# Patient Record
Sex: Male | Born: 1945
Health system: Southern US, Community
[De-identification: ages and names within clinical notes are randomized; demographics above are authoritative.]

## PROBLEM LIST (undated history)

## (undated) DIAGNOSIS — I10 Essential (primary) hypertension: Secondary | ICD-10-CM

## (undated) DIAGNOSIS — N189 Chronic kidney disease, unspecified: Secondary | ICD-10-CM

## (undated) DIAGNOSIS — N529 Male erectile dysfunction, unspecified: Secondary | ICD-10-CM

## (undated) DIAGNOSIS — Z992 Dependence on renal dialysis: Secondary | ICD-10-CM

## (undated) DIAGNOSIS — J449 Chronic obstructive pulmonary disease, unspecified: Secondary | ICD-10-CM

## (undated) DIAGNOSIS — M1990 Unspecified osteoarthritis, unspecified site: Secondary | ICD-10-CM

## (undated) DIAGNOSIS — N281 Cyst of kidney, acquired: Secondary | ICD-10-CM

## (undated) DIAGNOSIS — K7460 Unspecified cirrhosis of liver: Secondary | ICD-10-CM

## (undated) DIAGNOSIS — F329 Major depressive disorder, single episode, unspecified: Secondary | ICD-10-CM

## (undated) DIAGNOSIS — C801 Malignant (primary) neoplasm, unspecified: Secondary | ICD-10-CM

## (undated) DIAGNOSIS — N186 End stage renal disease: Secondary | ICD-10-CM

## (undated) DIAGNOSIS — F32A Depression, unspecified: Secondary | ICD-10-CM

## (undated) DIAGNOSIS — K5792 Diverticulitis of intestine, part unspecified, without perforation or abscess without bleeding: Secondary | ICD-10-CM

## (undated) DIAGNOSIS — I509 Heart failure, unspecified: Secondary | ICD-10-CM

## (undated) DIAGNOSIS — R188 Other ascites: Secondary | ICD-10-CM

## (undated) HISTORY — PX: COLON SURGERY: SHX602

## (undated) HISTORY — DX: Cyst of kidney, acquired: N28.1

## (undated) HISTORY — DX: Chronic kidney disease, unspecified: N18.9

## (undated) HISTORY — PX: PENILE PROSTHESIS IMPLANT: SHX240

## (undated) HISTORY — PX: DIALYSIS FISTULA CREATION: SHX611

## (undated) HISTORY — PX: APPENDECTOMY: SHX54

## (undated) HISTORY — DX: Malignant (primary) neoplasm, unspecified: C80.1

---

## 2003-09-26 DIAGNOSIS — I509 Heart failure, unspecified: Secondary | ICD-10-CM

## 2003-09-26 HISTORY — DX: Heart failure, unspecified: I50.9

## 2004-09-25 DIAGNOSIS — C801 Malignant (primary) neoplasm, unspecified: Secondary | ICD-10-CM

## 2004-09-25 HISTORY — DX: Malignant (primary) neoplasm, unspecified: C80.1

## 2014-07-29 ENCOUNTER — Encounter: Payer: Self-pay | Admitting: Urology

## 2014-07-29 ENCOUNTER — Other Ambulatory Visit: Payer: Self-pay | Admitting: Urology

## 2014-07-29 DIAGNOSIS — N2889 Other specified disorders of kidney and ureter: Secondary | ICD-10-CM

## 2014-08-06 ENCOUNTER — Ambulatory Visit
Admission: RE | Admit: 2014-08-06 | Discharge: 2014-08-06 | Disposition: A | Discharge status: Home / Self Care | Payer: Medicare Other | Source: Ambulatory Visit | Attending: Urology | Admitting: Urology

## 2014-08-06 DIAGNOSIS — N2889 Other specified disorders of kidney and ureter: Secondary | ICD-10-CM

## 2014-08-06 HISTORY — DX: Male erectile dysfunction, unspecified: N52.9

## 2014-08-06 HISTORY — DX: Essential (primary) hypertension: I10

## 2014-08-06 HISTORY — DX: End stage renal disease: Z99.2

## 2014-08-06 HISTORY — DX: Unspecified osteoarthritis, unspecified site: M19.90

## 2014-08-06 HISTORY — DX: Depression, unspecified: F32.A

## 2014-08-06 HISTORY — DX: Diverticulitis of intestine, part unspecified, without perforation or abscess without bleeding: K57.92

## 2014-08-06 HISTORY — DX: End stage renal disease: N18.6

## 2014-08-06 HISTORY — DX: Major depressive disorder, single episode, unspecified: F32.9

## 2014-08-06 HISTORY — DX: Heart failure, unspecified: I50.9

## 2014-08-06 NOTE — Consult Note (Signed)
Chief Complaint: 17 mm left upper pole enhancing solid renal mass consistent with a low-grade renal cell carcinoma.  Referring Physician(s): Eskew, [facility] regional physicians Urology  History of Present Illness: Michael Graham is a 67 y.o. male who was referred for evaluation of a solid enhancing left renal mass in the upper pole. Lesion measures 17 mm in greatest diameter. Findings are compatible with a small low-grade renal cell carcinoma. Patient is end stage renal disease and on dialysis. He is asymptomatic. No current flank or abdominal pain. No gross hematuria. Comparing prior CT scans, the left upper pole renal mass has enlarged since 2013.  Past Medical History  Diagnosis Date  . Chronic kidney disease     ESRD; on dialysis since 2008 or 2009  . Renal cysts, acquired, bilateral   . CHF (congestive heart failure) 2005  . Arthritis   . Cancer 2006    prostate; radiation therapy 2006?  Marland Kitchen Depression   . Diverticulitis   . Hypertension   . End stage renal disease on dialysis   . Erectile dysfunction     Past Surgical History  Procedure Laterality Date  . Appendectomy    . Penile prosthesis implant    . Colon surgery      partial colectomy    Allergies: Review of patient's allergies indicates no known allergies.  Medications: Prior to Admission medications   Medication Sig Start Date End Date Taking? Authorizing Provider  cinacalcet (SENSIPAR) 30 MG tablet Take 30 mg by mouth daily.   Yes Historical Provider, MD  latanoprost (XALATAN) 0.005 % ophthalmic solution Place 1 drop into both eyes at bedtime.   Yes Historical Provider, MD  metoprolol (LOPRESSOR) 50 MG tablet Take 50 mg by mouth daily.   Yes Historical Provider, MD  terazosin (HYTRIN) 5 MG capsule Take 5 mg by mouth at bedtime.   Yes Historical Provider, MD    No family history on file.  History   Social History  . Marital Status: Single    Spouse Name: N/A    Number of Children: N/A  . Years of  Education: N/A   Social History Main Topics  . Smoking status: Former Smoker -- 1.00 packs/day for 23 years    Types: Cigarettes    Start date: 08/07/1971    Quit date: 08/06/1994  . Smokeless tobacco: Never Used  . Alcohol Use: No  . Drug Use: Not on file  . Sexual Activity: Not on file   Other Topics Concern  . Not on file   Social History Narrative     Review of Systems: A 12 point ROS discussed and pertinent positives are indicated in the HPI above.  All other systems are negative.  Review of Systems  Constitutional: Negative for fever, chills, activity change and appetite change.  Respiratory: Negative for cough, chest tightness and shortness of breath.   Cardiovascular: Negative for chest pain.  Gastrointestinal: Negative for abdominal distention.  Genitourinary: Negative for dysuria, hematuria and flank pain.    Vital Signs: BP 129/74 mmHg  Pulse 62  Temp(Src) 98.4 F (36.9 C) (Oral)  Resp 14  Ht 5\' 8"  (1.727 m)  Wt 209 lb (94.802 kg)  BMI 31.79 kg/m2  SpO2 100%  Physical Exam  Constitutional: He is oriented to person, place, and time. He appears well-developed and well-nourished. No distress.  Cardiovascular: Normal rate and regular rhythm.   Murmur heard. Pulmonary/Chest: Effort normal and breath sounds normal. No respiratory distress. He has no wheezes.  Abdominal:  Soft. Bowel sounds are normal. He exhibits no distension.  Neurological: He is alert and oriented to person, place, and time.  Skin: Skin is warm. He is not diaphoretic. No erythema.  Psychiatric: He has a normal mood and affect. His behavior is normal. Judgment and thought content normal.    Imaging: Review of his abdominal CT scans and MRI scan confirm an enlarging 17 mm left upper pole solid enhancing renal mass compared to 2013. This is compatible with a small low-grade renal neoplasm or renal cell carcinoma. Other bilateral renal simple and complex cysts are stable Kidneys were atrophic.  No hydronephrosis.  Assessment and Plan:  1.7 cm solid enhancing left upper pole renal mass demonstrating enlargement since 2013 consistent with a low-grade renal neoplasm or renal cell carcinoma. Lesion is posterior and exophytic. This would be amenable to CT-guided cryoablation. Procedure, risks, benefits, and alternatives were reviewed. Patient understands the procedure. All questions were addressed. This can be performed under general anesthesia with an overnight recovery at [facility]. After our discussion, he would like to proceed with scheduling the procedure.  Thank you for this interesting consult.  I greatly enjoyed meeting Michael Graham and look forward to participating in their care.    I spent a total of 30 minutes face to face in clinical consultation, greater than 50% of which was counseling/coordinating care for the patient.  SignedGreggory Keen T. 08/06/2014, 2:56 PM

## 2014-09-07 HISTORY — PX: RENAL CRYOABLATION: SHX2322

## 2014-09-11 ENCOUNTER — Other Ambulatory Visit: Payer: Self-pay | Admitting: Radiology

## 2014-09-11 DIAGNOSIS — N2889 Other specified disorders of kidney and ureter: Secondary | ICD-10-CM

## 2014-09-23 ENCOUNTER — Other Ambulatory Visit (HOSPITAL_COMMUNITY): Payer: Self-pay | Admitting: Interventional Radiology

## 2014-09-23 ENCOUNTER — Encounter: Payer: Self-pay | Admitting: Radiology

## 2014-09-23 DIAGNOSIS — N2889 Other specified disorders of kidney and ureter: Secondary | ICD-10-CM

## 2014-09-29 ENCOUNTER — Emergency Department (HOSPITAL_BASED_OUTPATIENT_CLINIC_OR_DEPARTMENT_OTHER)
Admission: EM | Admit: 2014-09-29 | Discharge: 2014-09-29 | Disposition: A | Discharge status: Home / Self Care | Payer: Medicare Other | Attending: Emergency Medicine | Admitting: Emergency Medicine

## 2014-09-29 ENCOUNTER — Emergency Department (HOSPITAL_BASED_OUTPATIENT_CLINIC_OR_DEPARTMENT_OTHER): Payer: Medicare Other

## 2014-09-29 ENCOUNTER — Encounter (HOSPITAL_BASED_OUTPATIENT_CLINIC_OR_DEPARTMENT_OTHER): Payer: Self-pay | Admitting: Emergency Medicine

## 2014-09-29 DIAGNOSIS — N186 End stage renal disease: Secondary | ICD-10-CM | POA: Insufficient documentation

## 2014-09-29 DIAGNOSIS — I120 Hypertensive chronic kidney disease with stage 5 chronic kidney disease or end stage renal disease: Secondary | ICD-10-CM | POA: Insufficient documentation

## 2014-09-29 DIAGNOSIS — J069 Acute upper respiratory infection, unspecified: Secondary | ICD-10-CM | POA: Insufficient documentation

## 2014-09-29 DIAGNOSIS — Z992 Dependence on renal dialysis: Secondary | ICD-10-CM | POA: Insufficient documentation

## 2014-09-29 DIAGNOSIS — I509 Heart failure, unspecified: Secondary | ICD-10-CM | POA: Diagnosis not present

## 2014-09-29 DIAGNOSIS — R531 Weakness: Secondary | ICD-10-CM | POA: Diagnosis present

## 2014-09-29 DIAGNOSIS — Z8719 Personal history of other diseases of the digestive system: Secondary | ICD-10-CM | POA: Insufficient documentation

## 2014-09-29 DIAGNOSIS — Z87891 Personal history of nicotine dependence: Secondary | ICD-10-CM | POA: Insufficient documentation

## 2014-09-29 DIAGNOSIS — Z87438 Personal history of other diseases of male genital organs: Secondary | ICD-10-CM | POA: Diagnosis not present

## 2014-09-29 DIAGNOSIS — Z8739 Personal history of other diseases of the musculoskeletal system and connective tissue: Secondary | ICD-10-CM | POA: Insufficient documentation

## 2014-09-29 DIAGNOSIS — Z79899 Other long term (current) drug therapy: Secondary | ICD-10-CM | POA: Diagnosis not present

## 2014-09-29 DIAGNOSIS — Z8659 Personal history of other mental and behavioral disorders: Secondary | ICD-10-CM | POA: Diagnosis not present

## 2014-09-29 DIAGNOSIS — Z8546 Personal history of malignant neoplasm of prostate: Secondary | ICD-10-CM | POA: Insufficient documentation

## 2014-09-29 LAB — CBC
HCT: 29.8 % — ABNORMAL LOW (ref 39.0–52.0)
Hemoglobin: 9.9 g/dL — ABNORMAL LOW (ref 13.0–17.0)
MCH: 32.9 pg (ref 26.0–34.0)
MCHC: 33.2 g/dL (ref 30.0–36.0)
MCV: 99 fL (ref 78.0–100.0)
Platelets: 118 10*3/uL — ABNORMAL LOW (ref 150–400)
RBC: 3.01 MIL/uL — ABNORMAL LOW (ref 4.22–5.81)
RDW: 14.1 % (ref 11.5–15.5)
WBC: 7.2 10*3/uL (ref 4.0–10.5)

## 2014-09-29 LAB — BASIC METABOLIC PANEL
ANION GAP: 11 (ref 5–15)
BUN: 42 mg/dL — AB (ref 6–23)
CALCIUM: 8.5 mg/dL (ref 8.4–10.5)
CHLORIDE: 101 meq/L (ref 96–112)
CO2: 25 mmol/L (ref 19–32)
Creatinine, Ser: 7.75 mg/dL — ABNORMAL HIGH (ref 0.50–1.35)
GFR calc Af Amer: 7 mL/min — ABNORMAL LOW (ref >90)
GFR calc non Af Amer: 6 mL/min — ABNORMAL LOW (ref >90)
GLUCOSE: 126 mg/dL — AB (ref 70–99)
Potassium: 4.2 mmol/L (ref 3.5–5.1)
Sodium: 137 mmol/L (ref 135–145)

## 2014-09-29 LAB — TROPONIN I
Troponin I: 0.08 ng/mL — ABNORMAL HIGH (ref <0.031)
Troponin I: 0.09 ng/mL — ABNORMAL HIGH (ref <0.031)

## 2014-09-29 LAB — BRAIN NATRIURETIC PEPTIDE: B Natriuretic Peptide: 915.5 pg/mL — ABNORMAL HIGH (ref 0.0–100.0)

## 2014-09-29 NOTE — ED Notes (Signed)
69 yo male c/o leg weakness bilateraly for a week. Pt states he was dx w/ pneumonia Saturday and on antibiotic and steroids. Dialysis pt MWF. Pt a/o and ambulatory.

## 2014-09-29 NOTE — ED Provider Notes (Signed)
CSN: [id number]     Arrival date & time 09/29/14  1714 History   First MD Initiated Contact with Patient 09/29/14 1727     Chief Complaint  Patient presents with  . Extremity Weakness    legs     (Consider location/radiation/quality/duration/timing/severity/associated sxs/prior Treatment) Patient is a 69 y.o. male presenting with general illness.  Illness Location:  Nasal, upper airways, bil le Quality:  Congestion, weakness Severity:  Moderate Onset quality:  Gradual Timing:  Constant Progression:  Worsening Chronicity:  Recurrent Context:  Recent renal biopsy and PNA 1.5 weeks ago sp abx and steroid course Relieved by:  Nothing Worsened by:  Nothing Associated symptoms: cough and rhinorrhea   Associated symptoms: no abdominal pain and no fever (has had chills)   Associated symptoms comment:  Bil le weakness   Past Medical History  Diagnosis Date  . Chronic kidney disease     ESRD; on dialysis since 2008 or 2009  . Renal cysts, acquired, bilateral   . CHF (congestive heart failure) 2005  . Arthritis   . Cancer 2006    prostate; radiation therapy 2006?  Marland Kitchen Depression   . Diverticulitis   . Hypertension   . End stage renal disease on dialysis   . Erectile dysfunction    Past Surgical History  Procedure Laterality Date  . Appendectomy    . Penile prosthesis implant    . Colon surgery      partial colectomy  . Renal cryoablation Left 09/07/2014   No family history on file. History  Substance Use Topics  . Smoking status: Former Smoker -- 1.00 packs/day for 23 years    Types: Cigarettes    Start date: 08/07/1971    Quit date: 08/06/1994  . Smokeless tobacco: Never Used  . Alcohol Use: No    Review of Systems  Constitutional: Negative for fever (has had chills).  HENT: Positive for rhinorrhea.   Respiratory: Positive for cough.   Gastrointestinal: Negative for abdominal pain.  All other systems reviewed and are negative.     Allergies  Review of  patient's allergies indicates no known allergies.  Home Medications   Prior to Admission medications   Medication Sig Start Date End Date Taking? Authorizing Provider  cinacalcet (SENSIPAR) 30 MG tablet Take 30 mg by mouth daily.    Historical Provider, MD  latanoprost (XALATAN) 0.005 % ophthalmic solution Place 1 drop into both eyes at bedtime.    Historical Provider, MD  metoprolol (LOPRESSOR) 50 MG tablet Take 50 mg by mouth daily.    Historical Provider, MD  terazosin (HYTRIN) 5 MG capsule Take 5 mg by mouth at bedtime.    Historical Provider, MD   BP 147/74 mmHg  Pulse 65  Temp(Src) 97.8 F (36.6 C) (Oral)  Resp 18  Ht 5\' 8"  (1.727 m)  Wt 211 lb (95.709 kg)  BMI 32.09 kg/m2  SpO2 100% Physical Exam  Constitutional: He is oriented to person, place, and time. He appears well-developed and well-nourished.  HENT:  Head: Normocephalic and atraumatic.  Eyes: Conjunctivae and EOM are normal.  Neck: Normal range of motion. Neck supple.  Cardiovascular: Normal rate, regular rhythm and normal heart sounds.   Pulmonary/Chest: Effort normal. No respiratory distress. He has rales in the right lower field and the left lower field.  Abdominal: He exhibits no distension. There is no tenderness. There is no rebound and no guarding.  Musculoskeletal: Normal range of motion.  Neurological: He is alert and oriented to person, place, and  time.  Skin: Skin is warm and dry.  Vitals reviewed.   ED Course  Procedures (including critical care time) Labs Review Labs Reviewed  BASIC METABOLIC PANEL - Abnormal; Notable for the following:    Glucose, Bld 126 (*)    BUN 42 (*)    Creatinine, Ser 7.75 (*)    GFR calc non Af Amer 6 (*)    GFR calc Af Amer 7 (*)    All other components within normal limits  CBC - Abnormal; Notable for the following:    RBC 3.01 (*)    Hemoglobin 9.9 (*)    HCT 29.8 (*)    Platelets 118 (*)    All other components within normal limits  TROPONIN I - Abnormal;  Notable for the following:    Troponin I 0.08 (*)    All other components within normal limits  BRAIN NATRIURETIC PEPTIDE - Abnormal; Notable for the following:    B Natriuretic Peptide 915.5 (*)    All other components within normal limits  TROPONIN I - Abnormal; Notable for the following:    Troponin I 0.09 (*)    All other components within normal limits    Imaging Review Dg Chest 2 View  09/29/2014   CLINICAL DATA:  Congestion, fever, cough.  Weakness.  EXAM: CHEST  2 VIEW  COMPARISON:  Radiographs and Chest CT 09/19/2014  FINDINGS: Decreased small bilateral pleural effusions. Increasing linear opacities in the right lower lung zone. Cardiomegaly and mediastinal contours are unchanged. There is no pulmonary edema. No pneumothorax. Left subclavian vascular stent again seen. No acute osseous abnormality.  IMPRESSION: Increasing linear opacities in the right lower lung zone, may reflect atelectasis, however pneumonia could have a similar appearance. Decreasing pleural effusions from prior.   Electronically Signed   By: Jeb Levering M.D.   On: 09/29/2014 18:17     EKG Interpretation   Date/Time:  Tuesday September 29 2014 17:54:00 EST Ventricular Rate:  65 PR Interval:  338 QRS Duration: 112 QT Interval:  464 QTC Calculation: 482 R Axis:   61 Text Interpretation:  Sinus rhythm with 1st degree A-V block Low voltage  QRS Incomplete left bundle branch block Prolonged QT Abnormal ECG compared  to outside ECG, no acute change from prior Confirmed by Debby Freiberg  [phone]) on 09/29/2014 7:33:43 PM      MDM   Final diagnoses:  Upper respiratory infection    69 y.o. male with pertinent PMH of ESRD MWF, Chf, reported renal cancer (unknown type), postate ca, HTN presents with recurrent rhinorrhea, cough, and generalized weakness most prominent in bil le.  No fevers, patient denies lateralizing weakness. On arrival today vitals signs and physical exam as above, unremarkable.  Obtained  labwork from [facility] and ECG, both of which are similar to labs today.  Delta troponin obtained due to weakness which was unremarkable.  He ambulated unassisted and was not ataxic.  Likely etiology of symptoms upper respiratory infection. Doubt PNA given clinical presentation, no leukocytosis, and 1 day of primarily rhinorrhea. DC home in stable condition.  I have reviewed all laboratory and imaging studies if ordered as above  1. Upper respiratory infection         Debby Freiberg, MD 09/30/14 1459

## 2014-09-29 NOTE — ED Notes (Signed)
Three iv attempts are unsuccessful. Able to obtain basic labs, md aware.

## 2014-09-29 NOTE — Discharge Instructions (Signed)
Cough, Adult ° A cough is a reflex that helps clear your throat and airways. It can help heal the body or may be a reaction to an irritated airway. A cough may only last 2 or 3 weeks (acute) or may last more than 8 weeks (chronic).  °CAUSES °Acute cough: °· Viral or bacterial infections. °Chronic cough: °· Infections. °· Allergies. °· Asthma. °· Post-nasal drip. °· Smoking. °· Heartburn or acid reflux. °· Some medicines. °· Chronic lung problems (COPD). °· Cancer. °SYMPTOMS  °· Cough. °· Fever. °· Chest pain. °· Increased breathing rate. °· High-pitched whistling sound when breathing (wheezing). °· Colored mucus that you cough up (sputum). °TREATMENT  °· A bacterial cough may be treated with antibiotic medicine. °· A viral cough must run its course and will not respond to antibiotics. °· Your caregiver may recommend other treatments if you have a chronic cough. °HOME CARE INSTRUCTIONS  °· Only take over-the-counter or prescription medicines for pain, discomfort, or fever as directed by your caregiver. Use cough suppressants only as directed by your caregiver. °· Use a cold steam vaporizer or humidifier in your bedroom or home to help loosen secretions. °· Sleep in a semi-upright position if your cough is worse at night. °· Rest as needed. °· Stop smoking if you smoke. °SEEK IMMEDIATE MEDICAL CARE IF:  °· You have pus in your sputum. °· Your cough starts to worsen. °· You cannot control your cough with suppressants and are losing sleep. °· You begin coughing up blood. °· You have difficulty breathing. °· You develop pain which is getting worse or is uncontrolled with medicine. °· You have a fever. °MAKE SURE YOU:  °· Understand these instructions. °· Will watch your condition. °· Will get help right away if you are not doing well or get worse. °Document Released: 03/10/2011 Document Revised: 12/04/2011 Document Reviewed: 03/10/2011 °ExitCare® Patient Information ©2015 [employer]. This information is not intended  to replace advice given to you by your health care provider. Make sure you discuss any questions you have with your health care provider. °Upper Respiratory Infection, Adult °An upper respiratory infection (URI) is also sometimes known as the common cold. The upper respiratory tract includes the nose, sinuses, throat, trachea, and bronchi. Bronchi are the airways leading to the lungs. Most people improve within 1 week, but symptoms can last up to 2 weeks. A residual cough may last even longer.  °CAUSES °Many different viruses can infect the tissues lining the upper respiratory tract. The tissues become irritated and inflamed and often become very moist. Mucus production is also common. A cold is contagious. You can easily spread the virus to others by oral contact. This includes kissing, sharing a glass, coughing, or sneezing. Touching your mouth or nose and then touching a surface, which is then touched by another person, can also spread the virus. °SYMPTOMS  °Symptoms typically develop 1 to 3 days after you come in contact with a cold virus. Symptoms vary from person to person. They may include: °· Runny nose. °· Sneezing. °· Nasal congestion. °· Sinus irritation. °· Sore throat. °· Loss of voice (laryngitis). °· Cough. °· Fatigue. °· Muscle aches. °· Loss of appetite. °· Headache. °· Low-grade fever. °DIAGNOSIS  °You might diagnose your own cold based on familiar symptoms, since most people get a cold 2 to 3 times a year. Your caregiver can confirm this based on your exam. Most importantly, your caregiver can check that your symptoms are not due to another disease such   as strep throat, sinusitis, pneumonia, asthma, or epiglottitis. Blood tests, throat tests, and X-rays are not necessary to diagnose a common cold, but they may sometimes be helpful in excluding other more serious diseases. Your caregiver will decide if any further tests are required. °RISKS AND COMPLICATIONS  °You may be at risk for a more severe  case of the common cold if you smoke cigarettes, have chronic heart disease (such as heart failure) or lung disease (such as asthma), or if you have a weakened immune system. The very young and very old are also at risk for more serious infections. Bacterial sinusitis, middle ear infections, and bacterial pneumonia can complicate the common cold. The common cold can worsen asthma and chronic obstructive pulmonary disease (COPD). Sometimes, these complications can require emergency medical care and may be life-threatening. °PREVENTION  °The best way to protect against getting a cold is to practice good hygiene. Avoid oral or hand contact with people with cold symptoms. Wash your hands often if contact occurs. There is no clear evidence that vitamin C, vitamin E, echinacea, or exercise reduces the chance of developing a cold. However, it is always recommended to get plenty of rest and practice good nutrition. °TREATMENT  °Treatment is directed at relieving symptoms. There is no cure. Antibiotics are not effective, because the infection is caused by a virus, not by bacteria. Treatment may include: °· Increased fluid intake. Sports drinks offer valuable electrolytes, sugars, and fluids. °· Breathing heated mist or steam (vaporizer or shower). °· Eating chicken soup or other clear broths, and maintaining good nutrition. °· Getting plenty of rest. °· Using gargles or lozenges for comfort. °· Controlling fevers with ibuprofen or acetaminophen as directed by your caregiver. °· Increasing usage of your inhaler if you have asthma. °Zinc gel and zinc lozenges, taken in the first 24 hours of the common cold, can shorten the duration and lessen the severity of symptoms. Pain medicines may help with fever, muscle aches, and throat pain. A variety of non-prescription medicines are available to treat congestion and runny nose. Your caregiver can make recommendations and may suggest nasal or lung inhalers for other symptoms.  °HOME  CARE INSTRUCTIONS  °· Only take over-the-counter or prescription medicines for pain, discomfort, or fever as directed by your caregiver. °· Use a warm mist humidifier or inhale steam from a shower to increase air moisture. This may keep secretions moist and make it easier to breathe. °· Drink enough water and fluids to keep your urine clear or pale yellow. °· Rest as needed. °· Return to work when your temperature has returned to normal or as your caregiver advises. You may need to stay home longer to avoid infecting others. You can also use a face mask and careful hand washing to prevent spread of the virus. °SEEK MEDICAL CARE IF:  °· After the first few days, you feel you are getting worse rather than better. °· You need your caregiver's advice about medicines to control symptoms. °· You develop chills, worsening shortness of breath, or brown or red sputum. These may be signs of pneumonia. °· You develop yellow or brown nasal discharge or pain in the face, especially when you bend forward. These may be signs of sinusitis. °· You develop a fever, swollen neck glands, pain with swallowing, or white areas in the back of your throat. These may be signs of strep throat. °SEEK IMMEDIATE MEDICAL CARE IF:  °· You have a fever. °· You develop severe or persistent headache, ear   pain, sinus pain, or chest pain. °· You develop wheezing, a prolonged cough, cough up blood, or have a change in your usual mucus (if you have chronic lung disease). °· You develop sore muscles or a stiff neck. °Document Released: 03/07/2001 Document Revised: 12/04/2011 Document Reviewed: 12/17/2013 °ExitCare® Patient Information ©2015 [employer]. This information is not intended to replace advice given to you by your health care provider. Make sure you discuss any questions you have with your health care provider. ° °

## 2014-10-15 ENCOUNTER — Ambulatory Visit
Admission: RE | Admit: 2014-10-15 | Discharge: 2014-10-15 | Disposition: A | Discharge status: Home / Self Care | Payer: Medicare Other | Source: Ambulatory Visit | Attending: Radiology | Admitting: Radiology

## 2014-10-15 DIAGNOSIS — N2889 Other specified disorders of kidney and ureter: Secondary | ICD-10-CM

## 2014-10-15 HISTORY — PX: IR GENERIC HISTORICAL: IMG1180011

## 2014-10-15 NOTE — Consult Note (Signed)
Chief Complaint: Chief Complaint  Patient presents with  . Follow-up    Cryoablation of Left Renal Mass    Referring Physician(s): Eskew, Urology - [facility] regional physicians.  History of Present Illness: Michael Graham is a 69 y.o. male with past medical history significant for end-stage renal disease who was found to have a solid enhancing renal mass within the upper pole of his left kidney. This lesion was noted to demonstrate minimal growth since 2013 and given poor surgical candidacy, the patient was referred for percutaneous renal cryoablation and biopsy which was performed at [facility] on 09/10/2014. Note, the patient was initially seen in consultation by my partner, Dr. Daryll Brod, however I performed the procedure as the patient wished to have the procedure performed before the end of the year and Dr. Fritz Pickerel schedule could not accommodate this request. Pathology confirmed an oncocytic lesion, most likely an oncocytoma.  Patient tolerated the procedure very well and was discharged the following day without incident. He states that he has fully recovered from the procedure and has returned to all activities of daily living. He denies flank pain. The patient is on dialysis though intermittently voids small amounts. He denies gross hematuria. He denies fever or chills. He is without complaint and pleased with his overall recovery.  The patient presents today for review of immediate postprocedural scan performed 10/13/2014. He is unaccompanied and serves as his own historian.  Past Medical History  Diagnosis Date  . Chronic kidney disease     ESRD; on dialysis since 2008 or 2009  . Renal cysts, acquired, bilateral   . CHF (congestive heart failure) 2005  . Arthritis   . Cancer 2006    prostate; radiation therapy 2006?  Marland Kitchen Depression   . Diverticulitis   . Hypertension   . End stage renal disease on dialysis   . Erectile dysfunction     Past Surgical  History  Procedure Laterality Date  . Appendectomy    . Penile prosthesis implant    . Colon surgery      partial colectomy  . Renal cryoablation Left 09/07/2014    Allergies: Review of patient's allergies indicates no known allergies.  Medications: Prior to Admission medications   Medication Sig Start Date End Date Taking? Authorizing Provider  cinacalcet (SENSIPAR) 30 MG tablet Take 30 mg by mouth daily.    Historical Provider, MD  latanoprost (XALATAN) 0.005 % ophthalmic solution Place 1 drop into both eyes at bedtime.    Historical Provider, MD  metoprolol (LOPRESSOR) 50 MG tablet Take 50 mg by mouth daily.    Historical Provider, MD  terazosin (HYTRIN) 5 MG capsule Take 5 mg by mouth at bedtime.    Historical Provider, MD    No family history on file.  History   Social History  . Marital Status: Married    Spouse Name: N/A    Number of Children: N/A  . Years of Education: N/A   Social History Main Topics  . Smoking status: Former Smoker -- 1.00 packs/day for 23 years    Types: Cigarettes    Start date: 08/07/1971    Quit date: 08/06/1994  . Smokeless tobacco: Never Used  . Alcohol Use: No  . Drug Use: Not on file  . Sexual Activity: Not on file   Other Topics Concern  . Not on file   Social History Narrative    ECOG Status: 1 - Symptomatic but completely ambulatory  Review of Systems: A  12 point ROS discussed and pertinent positives are indicated in the HPI above.  All other systems are negative.  Review of Systems  Vital Signs: BP 122/75 mmHg  Pulse 70  Temp(Src) 98.1 F (36.7 C) (Oral)  Resp 14  SpO2 98%  Physical Exam  Constitutional: He appears well-developed and well-nourished.  Abdominal: There is no tenderness. There is no rebound.  Ablation access sites along the left flank and not visible.  Skin: Skin is warm and dry.  Psychiatric: He has a normal mood and affect. His behavior is normal.    Imaging:  CT abdomen and pelvis -  10/13/2014; 06/25/2014; abdominal MRI -10/15-2015; CT guided left renal cryoablation - 09/10/2014  Selected images from preprocedural CT of the abdomen and pelvis, abdominal MRI as well as intraprocedural images from CT-guided left sided renal lesion cryoablation and biopsy as well as post procedural CT scan performed 10/13/2014 were reviewed with the patient.  Images demonstrate an excellent radiographic result with no residual enhancement of the ablated lesion arising from the posterior superior aspect of the left kidney. There is a minimal amount of expected stranding about the ablation site though there is no evidence of complication. No evidence of injury to adjacent organ.  Labs:  Pathology from sided renal cryoablation and biopsy demonstrated an oncocytic lesion, most likely an oncocytoma.   CBC:  Recent Labs  09/29/14 1750  WBC 7.2  HGB 9.9*  HCT 29.8*  PLT 118*    COAGS: No results for input(s): INR, APTT in the last 8760 hours.  BMP:  Recent Labs  09/29/14 1750  NA 137  K 4.2  CL 101  CO2 25  GLUCOSE 126*  BUN 42*  CALCIUM 8.5  CREATININE 7.75*  GFRNONAA 6*  GFRAA 7*    LIVER FUNCTION TESTS: No results for input(s): BILITOT, AST, ALT, ALKPHOS, PROT, ALBUMIN in the last 8760 hours.  TUMOR MARKERS: No results for input(s): AFPTM, CEA, CA199, CHROMGRNA in the last 8760 hours.  Assessment and Plan:  [name] is a 69 y.o. male with past medical history significant for end-stage renal disease who was found to have a solid enhancing renal mass within the upper pole of his left kidney for which he underwent a technically successful left-sided percutaneous renal cryoablation and biopsy on 09/10/2014 with pathologic analysis demonstrated an oncocytic lesion, most likely an oncocytoma.  The patient tolerated the procedure well and has recovered fully from the procedure and is without complaint.  Review of post residual CT scan demonstrates an excellent  radiographic result with no definitive residual nodular enhancing soft tissue and no evidence of complication. Images were reviewed and discussed in detail with the patient.   I will obtain a follow-up 3 month CT scan to insurecontinued involution of the ablation site. Note, the patient reported difficulty having an IV placed for his last contrast examination (due to his history of end-stage renal disease, currently on dialysis) and as such, the patient will likely require ultrasound-guided intravenous access which can be performed in the interventional radiology suite at [facility] prior to the contrased examination.  Additionally, given the likely benign pathologic result, subsequent surveillance imaging could be limited to noncontrast abdominal MRI as was obtained on 07/09/2014.  Ronny Bacon, MD   I spent a total of 15 minutes face to face in clinical consultation, greater than 50% of which was counseling/coordinating care for left sided renal lesion, post cryoablation and biopsy.  SignedSandi Mariscal 10/15/2014, 1:53 PM

## 2014-10-15 NOTE — Progress Notes (Signed)
Dialysis M, W, & Fr.  Denies hematuria.   Denies pain associated with Cryoablation.  Omarius Grantham Riki Rusk, RN 10/15/2014 1:27 PM

## 2014-12-03 ENCOUNTER — Other Ambulatory Visit (HOSPITAL_COMMUNITY): Payer: Self-pay | Admitting: Interventional Radiology

## 2014-12-03 ENCOUNTER — Other Ambulatory Visit: Payer: Self-pay | Admitting: *Deleted

## 2014-12-03 DIAGNOSIS — N2889 Other specified disorders of kidney and ureter: Secondary | ICD-10-CM

## 2014-12-31 ENCOUNTER — Ambulatory Visit
Admission: RE | Admit: 2014-12-31 | Discharge: 2014-12-31 | Disposition: A | Discharge status: Home / Self Care | Payer: Medicare Other | Source: Ambulatory Visit | Attending: Interventional Radiology | Admitting: Interventional Radiology

## 2014-12-31 DIAGNOSIS — N2889 Other specified disorders of kidney and ureter: Secondary | ICD-10-CM

## 2014-12-31 NOTE — Progress Notes (Signed)
Patient ID: Michael Graham, male   DOB: July 29, 1946, 69 y.o.   MRN: [id number]    Chief Complaint: Chief Complaint  Patient presents with  . Follow-up    follow up Cryoablation of Left Renal Mass     Referring Physician(s): Eskew, Urology - [facility]  History of Present Illness: Michael Graham is a 69 y.o. male with past medical history significant for end-stage renal disease who underwent a technically successful CT-guided left renal cryoablation on 09/10/2014 with biopsy compatible with an oncocytic lesion, most likely an oncocytoma.  The patient returns to the interventional radiology clinic today to discuss the results of his 4 month post procedural CT scan obtained 12/29/2014.  The patient is unaccompanied and serves as his own historian.  The patient is without complaint. Specifically, he denies any flank pain or procedural related discomfort. He is overall pleased with the procedure.  Past Medical History  Diagnosis Date  . Chronic kidney disease     ESRD; on dialysis since 2008 or 2009  . Renal cysts, acquired, bilateral   . CHF (congestive heart failure) 2005  . Arthritis   . Cancer 2006    prostate; radiation therapy 2006?  Marland Kitchen Depression   . Diverticulitis   . Hypertension   . End stage renal disease on dialysis   . Erectile dysfunction     Past Surgical History  Procedure Laterality Date  . Appendectomy    . Penile prosthesis implant    . Colon surgery      partial colectomy  . Renal cryoablation Left 09/07/2014    Allergies: Review of patient's allergies indicates no known allergies.  Medications: Prior to Admission medications   Medication Sig Start Date End Date Taking? Authorizing Provider  cinacalcet (SENSIPAR) 30 MG tablet Take 30 mg by mouth daily.   Yes Historical Provider, MD  latanoprost (XALATAN) 0.005 % ophthalmic solution Place 1 drop into both eyes at bedtime.   Yes Historical Provider, MD  metoprolol (LOPRESSOR) 50 MG tablet Take 50  mg by mouth daily.   Yes Historical Provider, MD  terazosin (HYTRIN) 5 MG capsule Take 5 mg by mouth at bedtime.   Yes Historical Provider, MD     No family history on file.  History   Social History  . Marital Status: Married    Spouse Name: N/A  . Number of Children: N/A  . Years of Education: N/A   Social History Main Topics  . Smoking status: Former Smoker -- 1.00 packs/day for 23 years    Types: Cigarettes    Start date: 08/07/1971    Quit date: 08/06/1994  . Smokeless tobacco: Never Used  . Alcohol Use: No  . Drug Use: Not on file  . Sexual Activity: Not on file   Other Topics Concern  . Not on file   Social History Narrative    ECOG Status: 1 - Symptomatic but completely ambulatory  Review of Systems: A 12 point ROS discussed and pertinent positives are indicated in the HPI above.  All other systems are negative.  Review of Systems  Constitutional: Negative.   Respiratory: Negative.   Cardiovascular: Negative.   Gastrointestinal: Negative.   Genitourinary: Negative.  Negative for flank pain.  Psychiatric/Behavioral: Negative.     Vital Signs: BP 106/64 mmHg  Pulse 57  Temp(Src) 98.3 F (36.8 C) (Oral)  Resp 14  SpO2 99%  Physical Exam  Imaging:  CT abdomen and pelvis - 12/29/2014; 10/13/2014; 06/25/2014; Abdominal MRI - 07/09/2014; CT guided left  sided renal cryoablation - 09/10/2014 -   Review of CT scan performed 01/08/2015 demonstrates expected involution of the cryoablation site. There is no definitive residual enhancing soft tissue to suggest locally recurrent disease.  Labs:  CBC:  Recent Labs  09/29/14 1750  WBC 7.2  HGB 9.9*  HCT 29.8*  PLT 118*    COAGS: No results for input(s): INR, APTT in the last 8760 hours.  BMP:  Recent Labs  09/29/14 1750  NA 137  K 4.2  CL 101  CO2 25  GLUCOSE 126*  BUN 42*  CALCIUM 8.5  CREATININE 7.75*  GFRNONAA 6*  GFRAA 7*   Assessment and Plan:  Michael Graham is a 69 y.o. male who  underwent a technically successful CT-guided left renal cryoablation on 09/10/2014 with biopsy compatible with an oncocytic lesion, most likely an oncocytoma.  The patient has recovered uneventfully from the procedure and is without complaint.  Review of CT scan performed 01/08/2015 demonstrates expected involution of the cryoablation site without residual enhancing soft tissue to suggest locally recurrent disease.  (Note, intravenous access was obtained with ultrasound guidance performed in the interventional radiology suite at [facility] prior to the CT scan - the patient was very thankful as previous acquisition of intravenous access has been difficult secondary to his history of end-stage renal disease).  As such, no additional intervention is warranted at this time.   I will obtain a follow-up CT scan in 6 months (06/2015).  I will again arrange for the patient to undergo an ultrasound-guided intravenous access in the interventional radiology suite at [facility] prior to the contrasted CT examination.  SignedSandi Mariscal 12/31/2014, 2:26 PM   I spent a total of 10 Minutes in face to face in clinical consultation, greater than 50% of which was counseling/coordinating care for left-sided oncocytoma, post left sided renal cryoablation.

## 2015-07-15 ENCOUNTER — Other Ambulatory Visit (HOSPITAL_COMMUNITY): Payer: Self-pay | Admitting: Interventional Radiology

## 2015-07-15 ENCOUNTER — Other Ambulatory Visit: Payer: Self-pay | Admitting: Radiology

## 2015-07-15 DIAGNOSIS — N2889 Other specified disorders of kidney and ureter: Secondary | ICD-10-CM

## 2015-08-12 ENCOUNTER — Ambulatory Visit
Admission: RE | Admit: 2015-08-12 | Discharge: 2015-08-12 | Disposition: A | Discharge status: Home / Self Care | Payer: Medicare Other | Source: Ambulatory Visit | Attending: Interventional Radiology | Admitting: Interventional Radiology

## 2015-08-12 DIAGNOSIS — N2889 Other specified disorders of kidney and ureter: Secondary | ICD-10-CM

## 2015-08-12 NOTE — Progress Notes (Signed)
Patient ID: Michael Graham, male   DOB: 1945/12/02, 69 y.o.   MRN: [id number]    Chief Complaint: Patient was seen in consultation today for  Chief Complaint  Patient presents with  . Follow-up    11 mo follow upCryoablation of Left Renal Cell Carcinoma   at the request of Olamae Ferrara  Referring Physician(s): Eskew  History of Present Illness: Michael Graham is a 69 y.o. male with past oral history significant for end-stage renal disease who underwent a technically successful CT-guided left renal cryoablation on 09/10/2014 with biopsy compatible with an oncocytic lesion, most likely an oncocytoma.  The patient returns to the interventional radiology clinic today to discuss the results of 11 month post procedural CT scan obtained 08/05/2015. The patient is unaccompanied and serves as his own historian.  The patient reports he has recently been diagnosed with bladder cancer for which he is currently considering treatment options including (per the patient) bilateral nephrectomy and cystectomy versus intra-bladder chemotherapy administration.   The patient is otherwise without complaint. He denies fever or chills. No unintentional weight loss or weight gain.   Past Medical History  Diagnosis Date  . Chronic kidney disease     ESRD; on dialysis since 2008 or 2009  . Renal cysts, acquired, bilateral   . CHF (congestive heart failure) 2005  . Arthritis   . Cancer 2006    prostate; radiation therapy 2006?  Marland Kitchen Depression   . Diverticulitis   . Hypertension   . End stage renal disease on dialysis   . Erectile dysfunction     Past Surgical History  Procedure Laterality Date  . Appendectomy    . Penile prosthesis implant    . Colon surgery      partial colectomy  . Renal cryoablation Left 09/07/2014    Allergies: Lisinopril  Medications: Prior to Admission medications   Medication Sig Start Date End Date Taking? Authorizing Provider  atenolol (TENORMIN) 12.5 mg TABS tablet Take  12.5 mg by mouth daily.   Yes Historical Provider, MD  cinacalcet (SENSIPAR) 30 MG tablet Take 30 mg by mouth daily.    Historical Provider, MD  latanoprost (XALATAN) 0.005 % ophthalmic solution Place 1 drop into both eyes at bedtime.    Historical Provider, MD  metoprolol (LOPRESSOR) 50 MG tablet Take 50 mg by mouth daily.    Historical Provider, MD  terazosin (HYTRIN) 5 MG capsule Take 5 mg by mouth at bedtime.    Historical Provider, MD     No family history on file.  Social History   Social History  . Marital Status: Married    Spouse Name: N/A  . Number of Children: N/A  . Years of Education: N/A   Social History Main Topics  . Smoking status: Former Smoker -- 1.00 packs/day for 23 years    Types: Cigarettes    Start date: 08/07/1971    Quit date: 08/06/1994  . Smokeless tobacco: Never Used  . Alcohol Use: No  . Drug Use: Not on file  . Sexual Activity: Not on file   Other Topics Concern  . Not on file   Social History Narrative    ECOG Status: 0 - Asymptomatic  Review of Systems: A 12 point ROS discussed and pertinent positives are indicated in the HPI above.  All other systems are negative.  Review of Systems  Constitutional: Negative for fever, chills, activity change and appetite change.  Respiratory: Negative.   Cardiovascular: Negative.     Vital Signs: BP 143/72  mmHg  Pulse 77  Temp(Src) 97.7 F (36.5 C) (Oral)  Resp 14  SpO2 100%  Physical Exam  Constitutional: He appears well-developed and well-nourished.  HENT:  Head: Normocephalic and atraumatic.  Nursing note and vitals reviewed.   Imaging:  CT scan performed 08/05/2015; 04/22/2015; 12/29/2014.  Review of most recent surveillance CT obtained 08/05/2015 demonstrates an excellent result without evidence of local disease recurrence. Grossly unchanged appearance of additional bilateral mixed attenuating though nonenhancing renal lesions favored to represent complex  cysts.  Labs:  CBC:  Recent Labs  09/29/14 1750  WBC 7.2  HGB 9.9*  HCT 29.8*  PLT 118*    COAGS: No results for input(s): INR, APTT in the last 8760 hours.  BMP:  Recent Labs  09/29/14 1750  NA 137  K 4.2  CL 101  CO2 25  GLUCOSE 126*  BUN 42*  CALCIUM 8.5  CREATININE 7.75*  GFRNONAA 6*  GFRAA 7*    LIVER FUNCTION TESTS: No results for input(s): BILITOT, AST, ALT, ALKPHOS, PROT, ALBUMIN in the last 8760 hours.  TUMOR MARKERS: No results for input(s): AFPTM, CEA, CA199, CHROMGRNA in the last 8760 hours.  Assessment and Plan:  Michael Graham is a 69 y.o. male with past oral history significant for end-stage renal disease who underwent a technically successful CT-guided left renal cryoablation on 09/10/2014 with biopsy compatible with an oncocytic lesion, most likely an oncocytoma.  Review of most recent surveillance CT obtained 08/05/2015 demonstrates an excellent result without evidence of local disease recurrence.   The patient reports he has recently been diagnosed with bladder cancer for which he is currently considering treatment options including (per the patient) bilateral nephrectomy and cystectomy versus intra-bladder chemotherapy administration.   If the patient indeed undergoes bilateral nephrectomy and cystectomy, continued surveillance will no longer be necessary.  If bilateral radical nephrectomy is not pursued, I will obtain a follow-up contrast enhanced renal protocol CT in approximately 6 months (June 2016).   gain, given patient's difficulty with intravenous access, I will arrange for the patient undergo an ultrasound-guided IV access interventional radiology suite at [facility] prior to the contrast examination.  A copy of this report was sent to the requesting provider on this date.  SignedSandi Mariscal 08/12/2015, 3:00 PM   I spent a total of 15 Minutes in face to face in clinical consultation, greater than 50%  of which was counseling/coordinating care for left-sided renal cryoablation

## 2015-08-24 ENCOUNTER — Other Ambulatory Visit: Payer: Self-pay | Admitting: Internal Medicine

## 2015-08-24 DIAGNOSIS — E041 Nontoxic single thyroid nodule: Secondary | ICD-10-CM

## 2015-08-26 ENCOUNTER — Ambulatory Visit
Admission: RE | Admit: 2015-08-26 | Discharge: 2015-08-26 | Disposition: A | Discharge status: Home / Self Care | Payer: Medicare Other | Source: Ambulatory Visit | Attending: Internal Medicine | Admitting: Internal Medicine

## 2015-08-26 ENCOUNTER — Other Ambulatory Visit (HOSPITAL_COMMUNITY)
Admission: RE | Admit: 2015-08-26 | Discharge: 2015-08-26 | Disposition: A | Discharge status: Home / Self Care | Payer: Medicare Other | Source: Ambulatory Visit | Attending: Interventional Radiology | Admitting: Interventional Radiology

## 2015-08-26 DIAGNOSIS — E041 Nontoxic single thyroid nodule: Secondary | ICD-10-CM

## 2015-11-09 ENCOUNTER — Emergency Department (HOSPITAL_BASED_OUTPATIENT_CLINIC_OR_DEPARTMENT_OTHER)
Admission: EM | Admit: 2015-11-09 | Discharge: 2015-11-09 | Disposition: A | Discharge status: Home / Self Care | Payer: Medicare Other | Attending: Emergency Medicine | Admitting: Emergency Medicine

## 2015-11-09 ENCOUNTER — Encounter (HOSPITAL_BASED_OUTPATIENT_CLINIC_OR_DEPARTMENT_OTHER): Payer: Self-pay | Admitting: *Deleted

## 2015-11-09 DIAGNOSIS — Z8739 Personal history of other diseases of the musculoskeletal system and connective tissue: Secondary | ICD-10-CM | POA: Insufficient documentation

## 2015-11-09 DIAGNOSIS — Z8659 Personal history of other mental and behavioral disorders: Secondary | ICD-10-CM | POA: Diagnosis not present

## 2015-11-09 DIAGNOSIS — Z992 Dependence on renal dialysis: Secondary | ICD-10-CM | POA: Diagnosis not present

## 2015-11-09 DIAGNOSIS — I509 Heart failure, unspecified: Secondary | ICD-10-CM | POA: Insufficient documentation

## 2015-11-09 DIAGNOSIS — Z8546 Personal history of malignant neoplasm of prostate: Secondary | ICD-10-CM | POA: Diagnosis not present

## 2015-11-09 DIAGNOSIS — Z8719 Personal history of other diseases of the digestive system: Secondary | ICD-10-CM | POA: Diagnosis not present

## 2015-11-09 DIAGNOSIS — Z87891 Personal history of nicotine dependence: Secondary | ICD-10-CM | POA: Diagnosis not present

## 2015-11-09 DIAGNOSIS — L03115 Cellulitis of right lower limb: Secondary | ICD-10-CM | POA: Insufficient documentation

## 2015-11-09 DIAGNOSIS — I120 Hypertensive chronic kidney disease with stage 5 chronic kidney disease or end stage renal disease: Secondary | ICD-10-CM | POA: Diagnosis not present

## 2015-11-09 DIAGNOSIS — N186 End stage renal disease: Secondary | ICD-10-CM | POA: Insufficient documentation

## 2015-11-09 DIAGNOSIS — Z79899 Other long term (current) drug therapy: Secondary | ICD-10-CM | POA: Insufficient documentation

## 2015-11-09 DIAGNOSIS — M79604 Pain in right leg: Secondary | ICD-10-CM | POA: Diagnosis present

## 2015-11-09 MED ORDER — DOXYCYCLINE HYCLATE 100 MG PO CAPS: 100.0000 mg | ORAL_CAPSULE | Freq: Two times a day (BID) | ORAL | Status: AC

## 2015-11-09 MED FILL — DOXYCYCLINE HYC 100 MG CAP: 100 | 7 days supply | Qty: 14 | Fill #0

## 2015-11-09 NOTE — ED Notes (Signed)
Red spot the size of a quarter on the back of his right lower leg since December. His MD treated it with antibiotics and diagnosed him with cellulitis.

## 2015-11-09 NOTE — ED Provider Notes (Signed)
CSN: [id number]     Arrival date & time 11/09/15  1136 History   First MD Initiated Contact with Patient 11/09/15 1225     Chief Complaint  Patient presents with  . Leg Pain     (Consider location/radiation/quality/duration/timing/severity/associated sxs/prior Treatment) HPI Michael Graham is a 70 y.o. male because of her evaluation of right-sided leg pain. Patient reports a red spot on the back of his right leg that started in December. He was seen at [facility] for this problem and discharged with Bactrim therapy. He reports that was ineffective and he still has some redness and tenderness around the area. Denies fevers, chills, nausea or vomiting, abdominal pain, numbness or weakness, chest pain or shortness of breath, cough, hemoptysis, history of blood clot. No other alleviating or aggravating factors.  Past Medical History  Diagnosis Date  . Chronic kidney disease     ESRD; on dialysis since 2008 or 2009  . Renal cysts, acquired, bilateral   . CHF (congestive heart failure) ([place]) 2005  . Arthritis   . Cancer [facility]) 2006    prostate; radiation therapy 2006?  Marland Kitchen Depression   . Diverticulitis   . Hypertension   . End stage renal disease on dialysis ([place])   . Erectile dysfunction    Past Surgical History  Procedure Laterality Date  . Appendectomy    . Penile prosthesis implant    . Colon surgery      partial colectomy  . Renal cryoablation Left 09/07/2014   No family history on file. Social History  Substance Use Topics  . Smoking status: Former Smoker -- 1.00 packs/day for 23 years    Types: Cigarettes    Start date: 08/07/1971    Quit date: 08/06/1994  . Smokeless tobacco: Never Used  . Alcohol Use: No    Review of Systems A 10 point review of systems was completed and was negative except for pertinent positives and negatives as mentioned in the history of present illness     Allergies  Lisinopril  Home Medications   Prior to Admission medications     Medication Sig Start Date End Date Taking? Authorizing Provider  atenolol (TENORMIN) 12.5 mg TABS tablet Take 12.5 mg by mouth daily.    Historical Provider, MD  cinacalcet (SENSIPAR) 30 MG tablet Take 30 mg by mouth daily.    Historical Provider, MD  doxycycline (VIBRAMYCIN) 100 MG capsule Take 1 capsule (100 mg total) by mouth 2 (two) times daily. One po bid x 7 days 11/09/15   Comer Locket, PA-C  latanoprost (XALATAN) 0.005 % ophthalmic solution Place 1 drop into both eyes at bedtime.    Historical Provider, MD  metoprolol (LOPRESSOR) 50 MG tablet Take 50 mg by mouth daily.    Historical Provider, MD  terazosin (HYTRIN) 5 MG capsule Take 5 mg by mouth at bedtime.    Historical Provider, MD   BP 172/79 mmHg  Pulse 71  Temp(Src) 98.1 F (36.7 C) (Oral)  Resp 18  Ht 5\' 8"  (1.727 m)  Wt 89.359 kg  BMI 29.96 kg/m2  SpO2 98% Physical Exam  Constitutional: He is oriented to person, place, and time. He appears well-developed and well-nourished.  HENT:  Head: Normocephalic and atraumatic.  Mouth/Throat: Oropharynx is clear and moist.  Eyes: Conjunctivae are normal. Pupils are equal, round, and reactive to light. Right eye exhibits no discharge. Left eye exhibits no discharge. No scleral icterus.  Neck: Neck supple.  Cardiovascular: Normal rate, regular rhythm and normal heart  sounds.   Pulmonary/Chest: Effort normal and breath sounds normal. No respiratory distress. He has no wheezes. He has no rales.  Abdominal: Soft. There is no tenderness.  Musculoskeletal: He exhibits no tenderness.  Neurological: He is alert and oriented to person, place, and time.  Cranial Nerves II-XII grossly intact  Skin: Skin is warm and dry. No rash noted.  Posterior aspect of right lower extremity, distal tibia soft tissue shows some erythema, tenderness approximately 5 cm in diameter. No fluctuance. Mild induration  Psychiatric: He has a normal mood and affect.  Nursing note and vitals  reviewed.     ED Course  Procedures (including critical care time) Labs Review Labs Reviewed - No data to display  Imaging Review No results found. I have personally reviewed and evaluated these images and lab results as part of my medical decision-making.   EKG Interpretation None      EMERGENCY DEPARTMENT US SOFT TISSUE INTERPRETATION "Study: Limited Ultrasound of the noted body part in comments below"  INDICATIONS: Soft tissue infection Multiple views of the body part are obtained with a multi-frequency linear probe  PERFORMED BY:  Myself  IMAGES ARCHIVED?: Yes  SIDE:Right   BODY PART:Lower extremity  FINDINGS: No abcess noted and Cellulitis present  LIMITATIONS:  Body Habitus  INTERPRETATION:  Cellulitis present  COMMENT:  Mild cellulitis with no abscess.   MDM  Michael Graham is a 70 y.o. male history of diabetes and comes in for evaluation of cellulitis to posterior aspect of right lower extremity. Patient reports he was treated for this earlier in December with Bactrim which was ineffective. On exam today he does have a very small area of erythema, induration and tenderness to posterior aspect of distal right lower extremity. Ultrasound confirms cellulitis with no abscess. Plan to initiate antibiotic therapy, marked area of cellulitis and instructed patient to return if 3 days for wound recheck. He and his cousin at bedside agree with this plan. Overall he appears very well, nontoxic, hemodynamically stable and appropriate for discharge. Suspect original tachycardia was due to movement and walking back to emergency department as it quickly resolves with rest.  The patient appears reasonably screened and/or stabilized for discharge and I doubt any other medical condition or other [facility] requiring further screening, evaluation, or treatment in the ED at this time prior to discharge.   Final diagnoses:  Cellulitis of right lower extremity          Comer Locket, PA-C 11/09/15 1640  Leonard Schwartz, MD 11/17/15 [phone]

## 2015-11-09 NOTE — ED Notes (Signed)
Patient ambulates to and from radiology department. 

## 2015-11-09 NOTE — Discharge Instructions (Signed)
Please take your antibiotics as prescribed. Follow-up with your doctor or an urgent care facility in 3 days for a wound recheck. Return to ED sooner if redness starts to spread or you develop fevers.  Cellulitis Cellulitis is an infection of the skin and the tissue under the skin. The infected area is usually red and tender. This happens most often in the arms and lower legs. HOME CARE   Take your antibiotic medicine as told. Finish the medicine even if you start to feel better.  Keep the infected arm or leg raised (elevated).  Put a warm cloth on the area up to 4 times per day.  Only take medicines as told by your doctor.  Keep all doctor visits as told. GET HELP IF:  You see red streaks on the skin coming from the infected area.  Your red area gets bigger or turns a dark color.  Your bone or joint under the infected area is painful after the skin heals.  Your infection comes back in the same area or different area.  You have a puffy (swollen) bump in the infected area.  You have new symptoms.  You have a fever. GET HELP RIGHT AWAY IF:   You feel very sleepy.  You throw up (vomit) or have watery poop (diarrhea).  You feel sick and have muscle aches and pains.   This information is not intended to replace advice given to you by your health care provider. Make sure you discuss any questions you have with your health care provider.   Document Released: 02/28/2008 Document Revised: 06/02/2015 Document Reviewed: 11/27/2011 Elsevier Interactive Patient Education [employer].

## 2016-02-12 ENCOUNTER — Emergency Department (HOSPITAL_BASED_OUTPATIENT_CLINIC_OR_DEPARTMENT_OTHER)
Admission: EM | Admit: 2016-02-12 | Discharge: 2016-02-12 | Disposition: A | Discharge status: Home / Self Care | Payer: Medicare Other | Attending: Emergency Medicine | Admitting: Emergency Medicine

## 2016-02-12 ENCOUNTER — Emergency Department (HOSPITAL_BASED_OUTPATIENT_CLINIC_OR_DEPARTMENT_OTHER): Payer: Medicare Other

## 2016-02-12 ENCOUNTER — Encounter (HOSPITAL_BASED_OUTPATIENT_CLINIC_OR_DEPARTMENT_OTHER): Payer: Self-pay | Admitting: *Deleted

## 2016-02-12 DIAGNOSIS — Z87891 Personal history of nicotine dependence: Secondary | ICD-10-CM | POA: Diagnosis not present

## 2016-02-12 DIAGNOSIS — Z992 Dependence on renal dialysis: Secondary | ICD-10-CM | POA: Insufficient documentation

## 2016-02-12 DIAGNOSIS — J069 Acute upper respiratory infection, unspecified: Secondary | ICD-10-CM | POA: Diagnosis not present

## 2016-02-12 DIAGNOSIS — Z79899 Other long term (current) drug therapy: Secondary | ICD-10-CM | POA: Insufficient documentation

## 2016-02-12 DIAGNOSIS — F329 Major depressive disorder, single episode, unspecified: Secondary | ICD-10-CM | POA: Diagnosis not present

## 2016-02-12 DIAGNOSIS — I509 Heart failure, unspecified: Secondary | ICD-10-CM | POA: Insufficient documentation

## 2016-02-12 DIAGNOSIS — N186 End stage renal disease: Secondary | ICD-10-CM | POA: Diagnosis not present

## 2016-02-12 DIAGNOSIS — I132 Hypertensive heart and chronic kidney disease with heart failure and with stage 5 chronic kidney disease, or end stage renal disease: Secondary | ICD-10-CM | POA: Diagnosis not present

## 2016-02-12 DIAGNOSIS — R05 Cough: Secondary | ICD-10-CM | POA: Diagnosis present

## 2016-02-12 MED ORDER — BENZONATATE 100 MG PO CAPS: 100.0000 mg | ORAL_CAPSULE | Freq: Three times a day (TID) | ORAL | Status: AC | PRN

## 2016-02-12 NOTE — ED Notes (Signed)
MD at bedside. 

## 2016-02-12 NOTE — ED Notes (Signed)
Patient c/o cough since Thursday, chills at night, decreased appetite

## 2016-02-12 NOTE — ED Provider Notes (Signed)
CSN: [id number]     Arrival date & time 02/12/16  [id number] History   First MD Initiated Contact with Patient 02/12/16 0940     Chief Complaint  Patient presents with  . Cough     (Consider location/radiation/quality/duration/timing/severity/associated sxs/prior Treatment) HPI  70 year old male with a history of end-stage renal disease on the ounces presents with a cough for the past 2 days. Patient states that the cough is dry. He has had a little bit of rhinorrhea and had a mild sore throat that seems to have already gone away. Denies chest pain or shortness of breath. Feels a subjective fever at night and wakes up with sweats in the morning. Patient went to the pharmacy and has been trying guanefesin with no relief. Patient also feels generalized fatigue, mild. Has had decreased appetite since this started. Last went to dialysis yesterday and did all but 30 minutes of his session because he was not feeling well.  Past Medical History  Diagnosis Date  . Chronic kidney disease     ESRD; on dialysis since 2008 or 2009  . Renal cysts, acquired, bilateral   . CHF (congestive heart failure) ([place]) 2005  . Arthritis   . Cancer [facility]) 2006    prostate; radiation therapy 2006?  Marland Kitchen Depression   . Diverticulitis   . Hypertension   . End stage renal disease on dialysis ([place])   . Erectile dysfunction    Past Surgical History  Procedure Laterality Date  . Appendectomy    . Penile prosthesis implant    . Colon surgery      partial colectomy  . Renal cryoablation Left 09/07/2014   No family history on file. Social History  Substance Use Topics  . Smoking status: Former Smoker -- 1.00 packs/day for 23 years    Types: Cigarettes    Start date: 08/07/1971    Quit date: 08/06/1994  . Smokeless tobacco: Never Used  . Alcohol Use: No    Review of Systems  Constitutional: Positive for fever, diaphoresis and fatigue.  HENT: Positive for rhinorrhea and sore throat.   Respiratory: Positive for  cough. Negative for shortness of breath.   Cardiovascular: Negative for chest pain and leg swelling.  All other systems reviewed and are negative.     Allergies  Lisinopril  Home Medications   Prior to Admission medications   Medication Sig Start Date End Date Taking? Authorizing Provider  sevelamer carbonate (RENVELA) 800 MG tablet Take 800 mg by mouth 3 (three) times daily with meals.   Yes Historical Provider, MD  atenolol (TENORMIN) 12.5 mg TABS tablet Take 12.5 mg by mouth daily.    Historical Provider, MD  cinacalcet (SENSIPAR) 30 MG tablet Take 30 mg by mouth daily.    Historical Provider, MD  doxycycline (VIBRAMYCIN) 100 MG capsule Take 1 capsule (100 mg total) by mouth 2 (two) times daily. One po bid x 7 days 11/09/15   Comer Locket, PA-C  latanoprost (XALATAN) 0.005 % ophthalmic solution Place 1 drop into both eyes at bedtime.    Historical Provider, MD  metoprolol (LOPRESSOR) 50 MG tablet Take 50 mg by mouth daily.    Historical Provider, MD  terazosin (HYTRIN) 5 MG capsule Take 5 mg by mouth at bedtime.    Historical Provider, MD   BP 169/85 mmHg  Pulse 78  Temp(Src) 98.6 F (37 C) (Oral)  Resp 20  Ht 5\' 8"  (1.727 m)  Wt 201 lb (91.173 kg)  BMI 30.57 kg/m2  SpO2 98%  Physical Exam  Constitutional: He is oriented to person, place, and time. He appears well-developed and well-nourished. No distress.  HENT:  Head: Normocephalic and atraumatic.  Right Ear: External ear normal.  Left Ear: External ear normal.  Nose: Nose normal.  Mouth/Throat: Oropharynx is clear and moist. No oropharyngeal exudate.  Eyes: Right eye exhibits no discharge. Left eye exhibits no discharge.  Neck: Neck supple.  Cardiovascular: Normal rate, regular rhythm, normal heart sounds and intact distal pulses.   Pulmonary/Chest: Effort normal and breath sounds normal. He has no wheezes. He has no rales.  Abdominal: Soft. There is no tenderness.  Musculoskeletal: He exhibits no edema.  Left  upper arm fistula with + thrill  Neurological: He is alert and oriented to person, place, and time.  Skin: Skin is warm and dry. He is not diaphoretic.  Nursing note and vitals reviewed.   ED Course  Procedures (including critical care time) Labs Review Labs Reviewed - No data to display  Imaging Review Dg Chest 2 View  02/12/2016  CLINICAL DATA:  Initial encounter. 70 y/o male with dry cough since Thursday. No other complaints. Hx CHF, non-smoker EXAM: CHEST  2 VIEW COMPARISON:  09/21/2015 FINDINGS: Cardiac silhouette is mildly enlarged. Aorta is mildly uncoiled. No mediastinal or hilar masses or evidence of adenopathy. Clear lungs.  No pleural effusion or pneumothorax. Stable left subclavian region stent. Bony thorax is unremarkable. IMPRESSION: No acute cardiopulmonary disease. Electronically Signed   By: Lajean Manes M.D.   On: 02/12/2016 10:29   I have personally reviewed and evaluated these images and lab results as part of my medical decision-making.   EKG Interpretation None      MDM   Final diagnoses:  Upper respiratory infection    Patient symptoms are consistent with a viral upper respiratory infection. He overall appears well. I discussed getting basic lab work given he did not finish dialysis yesterday and has been feeling generalized fatigue. Patient declines this despite me talking about possible concerns such as hyperkalemia or anemia. He states he is a hard stick and absolutely refuses to get blood drawn today. His vitals are normal and he is overall well-appearing and my suspicion is low for serious illness. However I discussed return precautions. Will discharge with antitussives and otherwise treat a supportive care for a likely viral illness.    Sherwood Gambler, MD 02/12/16 1120

## 2016-05-02 ENCOUNTER — Observation Stay (HOSPITAL_BASED_OUTPATIENT_CLINIC_OR_DEPARTMENT_OTHER)
Admission: EM | Admit: 2016-05-02 | Discharge: 2016-05-03 | Disposition: A | Discharge status: Other Acute Inpatient Hospital | Payer: Medicare Other | Attending: Emergency Medicine | Admitting: Emergency Medicine

## 2016-05-02 ENCOUNTER — Encounter (HOSPITAL_BASED_OUTPATIENT_CLINIC_OR_DEPARTMENT_OTHER): Payer: Self-pay | Admitting: Emergency Medicine

## 2016-05-02 ENCOUNTER — Emergency Department (HOSPITAL_BASED_OUTPATIENT_CLINIC_OR_DEPARTMENT_OTHER): Payer: Medicare Other

## 2016-05-02 DIAGNOSIS — Z992 Dependence on renal dialysis: Secondary | ICD-10-CM | POA: Insufficient documentation

## 2016-05-02 DIAGNOSIS — Z9049 Acquired absence of other specified parts of digestive tract: Secondary | ICD-10-CM | POA: Diagnosis not present

## 2016-05-02 DIAGNOSIS — M316 Other giant cell arteritis: Secondary | ICD-10-CM

## 2016-05-02 DIAGNOSIS — Z8546 Personal history of malignant neoplasm of prostate: Secondary | ICD-10-CM | POA: Diagnosis not present

## 2016-05-02 DIAGNOSIS — I776 Arteritis, unspecified: Secondary | ICD-10-CM | POA: Diagnosis not present

## 2016-05-02 DIAGNOSIS — Z79899 Other long term (current) drug therapy: Secondary | ICD-10-CM | POA: Insufficient documentation

## 2016-05-02 DIAGNOSIS — R1084 Generalized abdominal pain: Secondary | ICD-10-CM | POA: Diagnosis present

## 2016-05-02 DIAGNOSIS — Z87891 Personal history of nicotine dependence: Secondary | ICD-10-CM | POA: Diagnosis not present

## 2016-05-02 DIAGNOSIS — N186 End stage renal disease: Secondary | ICD-10-CM | POA: Diagnosis not present

## 2016-05-02 DIAGNOSIS — I120 Hypertensive chronic kidney disease with stage 5 chronic kidney disease or end stage renal disease: Secondary | ICD-10-CM | POA: Diagnosis not present

## 2016-05-02 HISTORY — DX: Other ascites: R18.8

## 2016-05-02 HISTORY — DX: Chronic obstructive pulmonary disease, unspecified: J44.9

## 2016-05-02 HISTORY — DX: Unspecified cirrhosis of liver: K74.60

## 2016-05-02 LAB — CBC
HEMATOCRIT: 32.4 % — AB (ref 39.0–52.0)
Hemoglobin: 10.9 g/dL — ABNORMAL LOW (ref 13.0–17.0)
MCH: 29.8 pg (ref 26.0–34.0)
MCHC: 33.6 g/dL (ref 30.0–36.0)
MCV: 88.5 fL (ref 78.0–100.0)
Platelets: 144 10*3/uL — ABNORMAL LOW (ref 150–400)
RBC: 3.66 MIL/uL — AB (ref 4.22–5.81)
RDW: 14.8 % (ref 11.5–15.5)
WBC: 8.5 10*3/uL (ref 4.0–10.5)

## 2016-05-02 MED ORDER — MORPHINE SULFATE (PF) 4 MG/ML IV SOLN
4.0000 mg | Freq: Once | INTRAVENOUS | Status: AC
  Administered 2016-05-02: 4 mg via INTRAVENOUS
  Filled 2016-05-02: qty 1

## 2016-05-02 MED ORDER — IOPAMIDOL (ISOVUE-300) INJECTION 61%
100.0000 mL | Freq: Once | INTRAVENOUS | Status: AC | PRN
  Administered 2016-05-02: 100 mL via INTRAVENOUS

## 2016-05-02 MED ORDER — MORPHINE SULFATE (PF) 4 MG/ML IV SOLN
INTRAVENOUS | Status: AC
  Filled 2016-05-02: qty 1

## 2016-05-02 NOTE — ED Triage Notes (Signed)
Pt in c/o diffuse abd pain onset yesterdady, denies n/v/d. States feels like he is constipated but had normal BM yesterday. Pt alert, interactive, ambulatory in NAD.

## 2016-05-02 NOTE — ED Provider Notes (Addendum)
[facility] DEPT [facility] Provider Note   CSN: [id number] Arrival date & time: 05/02/16  1940  First Provider Contact:  None    By signing my name below, I, Emmanuella Mensah, attest that this documentation has been prepared under the direction and in the presence of Orlie Dakin, MD. Electronically Signed: Judithann Sauger, ED Scribe. 05/02/16. 9:47 PM.    History   Chief Complaint Chief Complaint  Patient presents with  . Abdominal Pain    HPI Comments: Michael Graham is a 70 y.o. male with a hx of prostate cancer, CHF, chronic kidney disease, and EDRD on dialysis who presents to the Emergency Department complaining of gradually worsening moderate generalized abdominal pain onset yesterday. He reports associated difficulty making a BM. He notes that this pain is consistent to when he was constipated in the past. No alleviating factors noted. Pt has not tried any medications PTA. He reports that he has been a dialysis pt for 9 years and was last dialyzed yesterday. He denies that he is a current smoker or any ETOH use. He reports an allergy to Lasix. He denies any fever, chills, rectal pain, or n/v/d. Pt reports that he drove to the ED but is able to get a ride home.   Pt presents with a wound to his right lower leg. No complaints.   PCP: Dr. Koleen Nimrod, [facility]   The history is provided by the patient. No language interpreter was used.    Past Medical History:  Diagnosis Date  . Arthritis   . Cancer [facility]) 2006   prostate; radiation therapy 2006?  . CHF (congestive heart failure) ([place]) 2005  . Chronic kidney disease    ESRD; on dialysis since 2008 or 2009  . Depression   . Diverticulitis   . End stage renal disease on dialysis ([place])   . Erectile dysfunction   . Hypertension   . Renal cysts, acquired, bilateral     Patient Active Problem List   Diagnosis Date Noted  . Left renal mass   . Renal mass, left     Past Surgical History:  Procedure Laterality Date  .  APPENDECTOMY    . COLON SURGERY     partial colectomy  . PENILE PROSTHESIS IMPLANT    . RENAL CRYOABLATION Left 09/07/2014       Home Medications    Prior to Admission medications   Medication Sig Start Date End Date Taking? Authorizing Provider  atenolol (TENORMIN) 12.5 mg TABS tablet Take 12.5 mg by mouth daily.    Historical Provider, MD  benzonatate (TESSALON) 100 MG capsule Take 1 capsule (100 mg total) by mouth 3 (three) times daily as needed for cough. 02/12/16   Sherwood Gambler, MD  cinacalcet (SENSIPAR) 30 MG tablet Take 30 mg by mouth daily.    Historical Provider, MD  doxycycline (VIBRAMYCIN) 100 MG capsule Take 1 capsule (100 mg total) by mouth 2 (two) times daily. One po bid x 7 days 11/09/15   Comer Locket, PA-C  latanoprost (XALATAN) 0.005 % ophthalmic solution Place 1 drop into both eyes at bedtime.    Historical Provider, MD  metoprolol (LOPRESSOR) 50 MG tablet Take 50 mg by mouth daily.    Historical Provider, MD  sevelamer carbonate (RENVELA) 800 MG tablet Take 800 mg by mouth 3 (three) times daily with meals.    Historical Provider, MD  terazosin (HYTRIN) 5 MG capsule Take 5 mg by mouth at bedtime.    Historical Provider, MD    Family History  History reviewed. No pertinent family history.  Social History Social History  Substance Use Topics  . Smoking status: Former Smoker    Packs/day: 1.00    Years: 23.00    Types: Cigarettes    Start date: 08/07/1971    Quit date: 08/06/1994  . Smokeless tobacco: Never Used  . Alcohol use No     Allergies   Lisinopril   Review of Systems Review of Systems  Constitutional: Negative.  Negative for chills and fever.  HENT: Negative.   Respiratory: Negative.   Cardiovascular: Negative.   Gastrointestinal: Positive for abdominal pain. Negative for diarrhea, nausea and vomiting.  Musculoskeletal: Negative.   Skin: Negative.   Neurological: Negative.   Psychiatric/Behavioral: Negative.   All other systems  reviewed and are negative.    Physical Exam Updated Vital Signs BP 145/90 (BP Location: Right Arm)   Pulse 75   Temp 98.5 F (36.9 C) (Oral)   Resp 20   Ht 5\' 8"  (1.727 m)   Wt 201 lb (91.2 kg)   SpO2 95%   BMI 30.56 kg/m   Physical Exam  Constitutional: He appears well-developed and well-nourished.  HENT:  Head: Normocephalic and atraumatic.  Eyes: Conjunctivae are normal. Pupils are equal, round, and reactive to light.  Neck: Neck supple. No tracheal deviation present. No thyromegaly present.  Cardiovascular: Normal rate and regular rhythm.   No murmur heard. Pulmonary/Chest: Effort normal and breath sounds normal.  Abdominal: Soft. Bowel sounds are normal. He exhibits no distension. There is no tenderness.  Musculoskeletal: Normal range of motion. He exhibits no edema or tenderness.  Neurological: He is alert. Coordination normal.  Skin: Skin is warm and dry. No rash noted.  Psychiatric: He has a normal mood and affect.  Nursing note and vitals reviewed.    ED Treatments / Results  DIAGNOSTIC STUDIES: Oxygen Saturation is 95% on RA, adequate by my interpretation.    COORDINATION OF CARE: 9:37 PM- Pt advised of plan for treatment and pt agrees. He will receive lab work and CT abdomen for further evaluation. He will also receive Morphine.    Labs (all labs ordered are listed, but only abnormal results are displayed) Labs Reviewed  CBC - Abnormal; Notable for the following:       Result Value   RBC 3.66 (*)    Hemoglobin 10.9 (*)    HCT 32.4 (*)    Platelets 144 (*)    All other components within normal limits  LIPASE, BLOOD  COMPREHENSIVE METABOLIC PANEL    EKG  EKG Interpretation None       Radiology No results found.  Procedures Procedures (including critical care time)  Medications Ordered in ED Medications - No data to display Results for orders placed or performed during the hospital encounter of 05/02/16  Lipase, blood  Result Value Ref  Range   Lipase 26 11 - 51 U/L  Comprehensive metabolic panel  Result Value Ref Range   Sodium 140 135 - 145 mmol/L   Potassium 3.6 3.5 - 5.1 mmol/L   Chloride 93 (L) 101 - 111 mmol/L   CO2 22 22 - 32 mmol/L   Glucose, Bld 91 65 - 99 mg/dL   BUN 44 (H) 6 - 20 mg/dL   Creatinine, Ser 10.20 (H) 0.61 - 1.24 mg/dL   Calcium 8.7 (L) 8.9 - 10.3 mg/dL   Total Protein 7.4 6.5 - 8.1 g/dL   Albumin 4.2 3.5 - 5.0 g/dL   AST 16 15 - 41 U/L  ALT 14 (L) 17 - 63 U/L   Alkaline Phosphatase 321 (H) 38 - 126 U/L   Total Bilirubin 0.6 0.3 - 1.2 mg/dL   GFR calc non Af Amer 5 (L) >60 mL/min   GFR calc Af Amer 5 (L) >60 mL/min   Anion gap >20 (H) 5 - 15  CBC  Result Value Ref Range   WBC 8.5 4.0 - 10.5 K/uL   RBC 3.66 (L) 4.22 - 5.81 MIL/uL   Hemoglobin 10.9 (L) 13.0 - 17.0 g/dL   HCT 32.4 (L) 39.0 - 52.0 %   MCV 88.5 78.0 - 100.0 fL   MCH 29.8 26.0 - 34.0 pg   MCHC 33.6 30.0 - 36.0 g/dL   RDW 14.8 11.5 - 15.5 %   Platelets 144 (L) 150 - 400 K/uL  Sedimentation rate  Result Value Ref Range   Sed Rate 35 (H) 0 - 16 mm/hr   Ct Abdomen Pelvis W Contrast  Result Date: 05/03/2016 CLINICAL DATA:  Diffuse abdominal pain for 2 days. EXAM: CT ABDOMEN AND PELVIS WITH CONTRAST TECHNIQUE: Multidetector CT imaging of the abdomen and pelvis was performed using the standard protocol following bolus administration of intravenous contrast. CONTRAST:  100 cc Isovue-300 IV COMPARISON:  Most recent CT 09/06/2015.  CT 08/05/2015 also reviewed FINDINGS: Lung bases: Small right pleural effusion, appears chronic. Adjacent atelectasis. There are coronary artery calcifications. Liver: Enhancing focus in the periphery of segment 7 of the liver, not confidently seen previously. Hepatobiliary: Gallbladder physiologically distended, no calcified stone. No biliary dilatation. Pancreas: Pancreatic atrophy.  No ductal dilatation or inflammation. Spleen:  Normal. Adrenals:  Stable adrenal thickening. Kidneys: Post bilateral  nephrectomies. Mild stranding in the nephrectomy bed to without focal soft tissue mass. Stomach/bowel: Stomach physiologically distended. No dilated small or large bowel loops. There is multifocal colonic diverticulosis without diverticulitis. Enteric sutures in the sigmoid colon. Appendix is not confidently identified, no pericecal inflammation. Left inguinal hernia contains nonobstructed small bowel. Lymphatic/vascular: Atherosclerosis of the abdominal aorta. There is periaortic stranding and thickening distally in at the iliac bifurcation. No frank retroperitoneal adenopathy. No pelvic adenopathy. Reproductive: Penile prosthesis in place.  Post prostatectomy. Bladder:  Post cystectomy. Other:  No free air free fluid.  No intra-abdominal abscess. Musculoskeletal:  No acute or suspicious abnormality. IMPRESSION: 1. Soft tissue stranding and thickening about the distal abdominal aorta and iliac bifurcation consistent with vasculitis or acute retroperitoneal fibrosis. There is dense atherosclerosis of the aorta and its branches which is unchanged. 2. New enhancing focus in the subcapsular right lobe of the liver, nonspecific. Hypervascular metastasis is not excluded. Recommend hepatic MRI on a nonemergent basis. 3. Left inguinal hernia containing nonobstructing small bowel. Colonic diverticulosis without diverticulitis. 4. Post bilateral nephrectomies, cystectomy, and prostatectomy. Electronically Signed   By: Jeb Levering M.D.   On: 05/03/2016 04:28    Initial Impression / Assessment and Plan / ED Course  Orlie Dakin, MD has reviewed the triage vital signs and the nursing notes.  Pertinent labs & imaging results that were available during my care of the patient were reviewed by me and considered in my medical decision making (see chart for details).  Clinical Course  Patient signed out to [facility] at [id number] PM during EPIC downtime    Final Clinical Impressions(s) / ED Diagnoses  Dx Generalized  abdominal pain Final diagnoses:  None      New Prescriptions New Prescriptions   No medications on file     Orlie Dakin, MD 05/03/16 1112  Orlie Dakin, MD 06/15/16 1737

## 2016-05-03 ENCOUNTER — Encounter (HOSPITAL_BASED_OUTPATIENT_CLINIC_OR_DEPARTMENT_OTHER): Payer: Self-pay | Admitting: Emergency Medicine

## 2016-05-03 DIAGNOSIS — I776 Arteritis, unspecified: Secondary | ICD-10-CM

## 2016-05-03 LAB — COMPREHENSIVE METABOLIC PANEL
ALK PHOS: 321 U/L — AB (ref 38–126)
ALT: 14 U/L — ABNORMAL LOW (ref 17–63)
AST: 16 U/L (ref 15–41)
Albumin: 4.2 g/dL (ref 3.5–5.0)
BILIRUBIN TOTAL: 0.6 mg/dL (ref 0.3–1.2)
BUN: 44 mg/dL — ABNORMAL HIGH (ref 6–20)
CALCIUM: 8.7 mg/dL — AB (ref 8.9–10.3)
CO2: 22 mmol/L (ref 22–32)
Chloride: 93 mmol/L — ABNORMAL LOW (ref 101–111)
Creatinine, Ser: 10.2 mg/dL — ABNORMAL HIGH (ref 0.61–1.24)
GFR, EST AFRICAN AMERICAN: 5 mL/min — AB (ref >60)
GFR, EST NON AFRICAN AMERICAN: 5 mL/min — AB (ref >60)
Glucose, Bld: 91 mg/dL (ref 65–99)
POTASSIUM: 3.6 mmol/L (ref 3.5–5.1)
Sodium: 140 mmol/L (ref 135–145)
TOTAL PROTEIN: 7.4 g/dL (ref 6.5–8.1)

## 2016-05-03 LAB — SEDIMENTATION RATE: SED RATE: 35 mm/h — AB (ref 0–16)

## 2016-05-03 LAB — LIPASE, BLOOD: LIPASE: 26 U/L (ref 11–51)

## 2016-05-03 MED ORDER — MORPHINE SULFATE (PF) 4 MG/ML IV SOLN
4.0000 mg | Freq: Once | INTRAVENOUS | Status: AC
  Administered 2016-05-03: 4 mg via INTRAVENOUS

## 2016-05-03 MED ORDER — METHYLPREDNISOLONE SODIUM SUCC 125 MG IJ SOLR
125.0000 mg | Freq: Once | INTRAMUSCULAR | Status: AC
  Administered 2016-05-03: 125 mg via INTRAVENOUS
  Filled 2016-05-03: qty 2

## 2016-05-03 NOTE — Plan of Care (Signed)
70 yo M with ESRD, patient presents to ED with c/o abdominal pain.  Work up today appears to show arteritis of the aorta / iliac vessels on CT scan.  Also has an enhancing lesion in liver that needs non-emergent MRI.  No headache, and ESR not yet ordered, but findings suspicious of possible giant cell arteritis or takayasu (latter is less likely in this gender and age group).  Patient got put on steroids and is getting shipped to med surg.  Consider vascular consult for temporal artery biopsy (although he is having no jaw claudication or other more common signs of GCA).

## 2016-05-03 NOTE — ED Notes (Signed)
[facility]  Here for transport to [facility] at this time .

## 2016-05-03 NOTE — ED Notes (Signed)
Pt requesting to go to [facility]. Pt was accepted by hospitalist at [place] bed assignment.

## 2016-05-03 NOTE — ED Notes (Signed)
Late entries due to computer downtime.

## 2016-05-03 NOTE — ED Notes (Signed)
Dr. Roxanne Mins at bedside to discuss CT results.

## 2016-05-03 NOTE — ED Provider Notes (Addendum)
Patient initially seen and evaluated by Dr. Winfred Leeds with abdominal pain, sent for CT of abdomen and pelvis. CT shows evidence of arteritis involving the aorta and iliac vessels. I have reviewed the images myself and discussed the findings with the radiologist. This might represent joint cell arteritis and might represent Takayuso Arteritis. He is given an dose of methylprednisolone and is being transferred to [facility] for admission.     Delora Fuel, MD [id number]  Patient requests admission to [facility] instead of [facility]. Case is discussed with Dr. Florene Glen of hospitalist service at [facility] who agrees to accept the patient in transfer.   Delora Fuel, MD [id number]

## 2016-09-27 ENCOUNTER — Encounter: Payer: Self-pay | Admitting: Interventional Radiology

## 2017-07-28 IMAGING — US US THYROID BIOPSY
1 series · 14 of 14 positions shown · non-contrast
Comparison: None.

CLINICAL DATA: Dominant left thyroid nodule

EXAM:
ULTRASOUND GUIDED NEEDLE ASPIRATE BIOPSY OF THE THYROID GLAND

[Series 1: us thyroid biopsy · 0.09mm/px · 14 acquisitions, 14 frames shown]
[im 1/14]
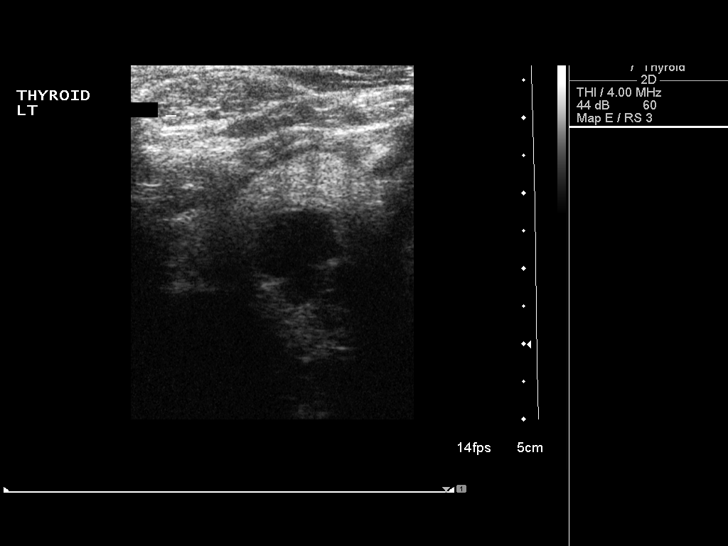
[im 2/14]
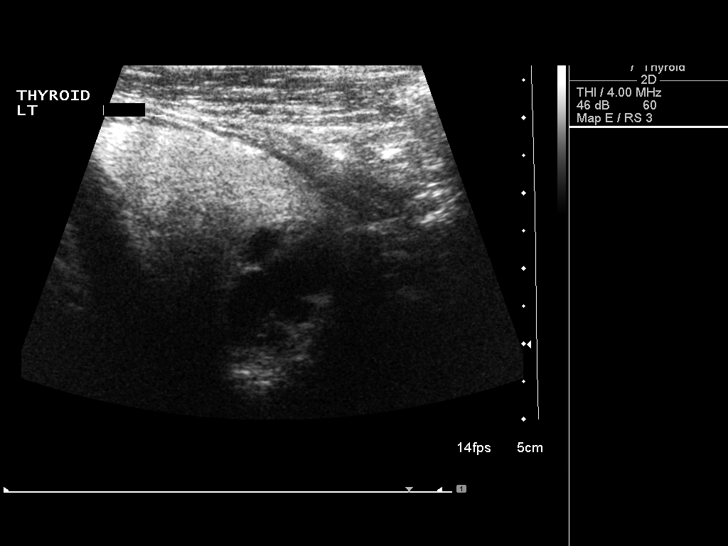
[im 3/14]
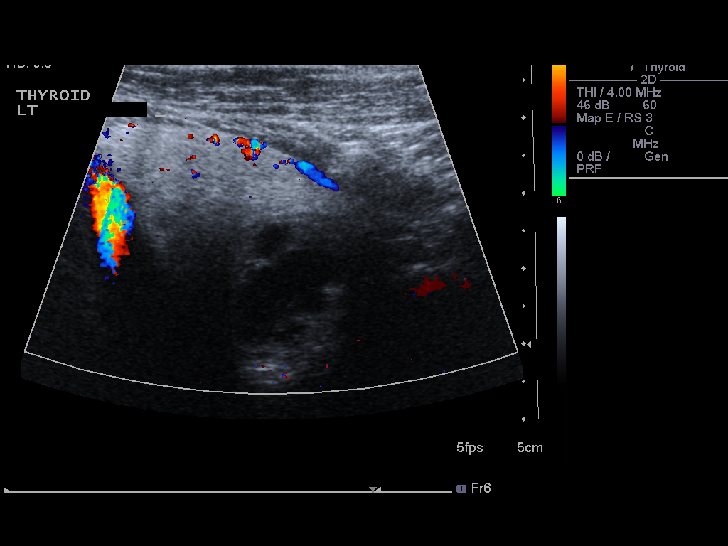
[im 4/14]
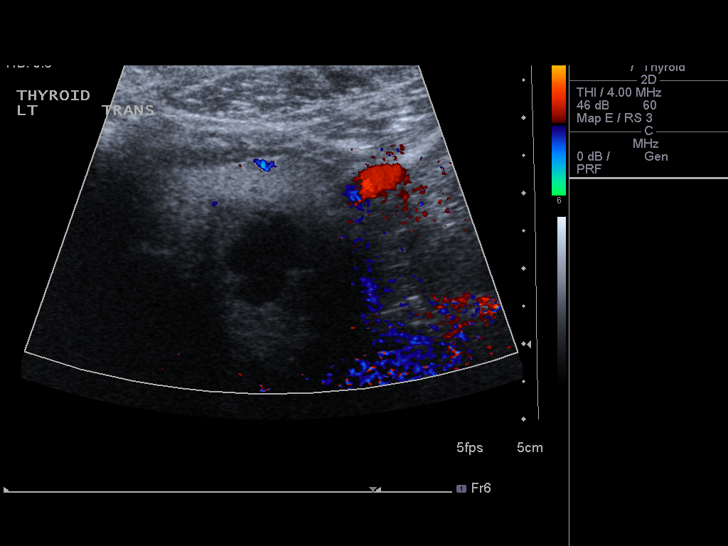
[im 5/14]
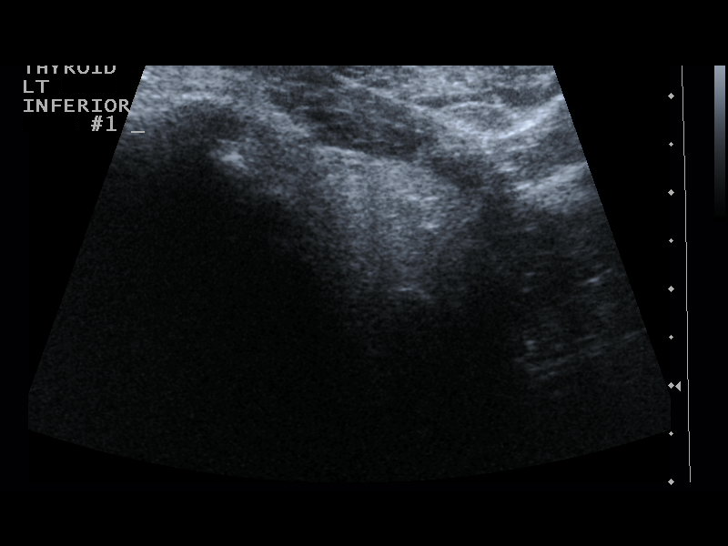
[im 6/14]
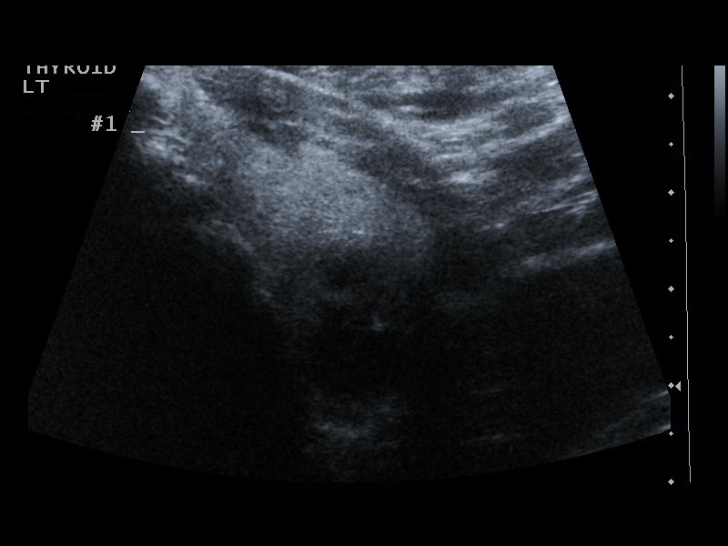
[im 7/14]
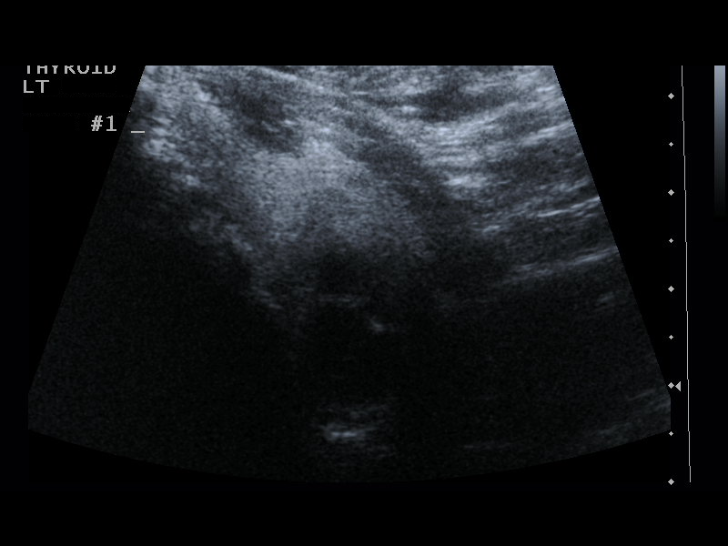
[im 8/14]
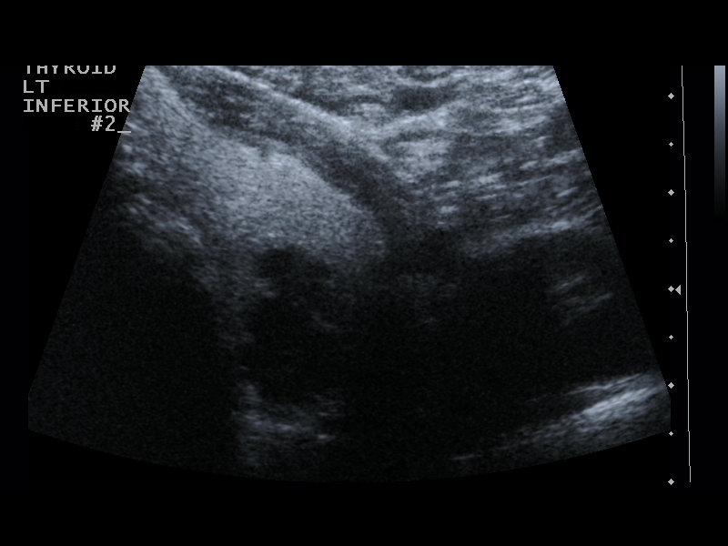
[im 9/14]
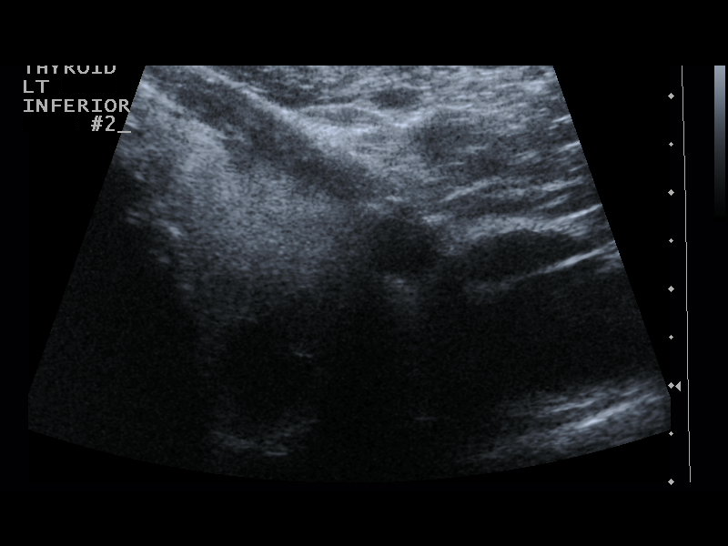
[im 10/14]
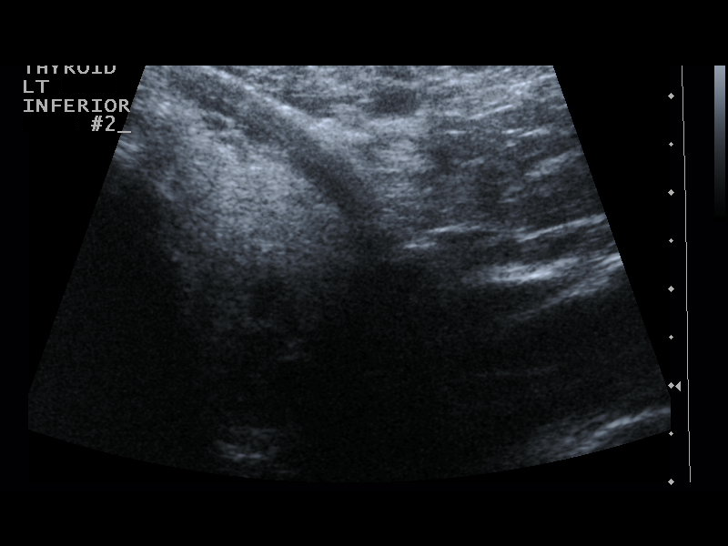
[im 11/14]
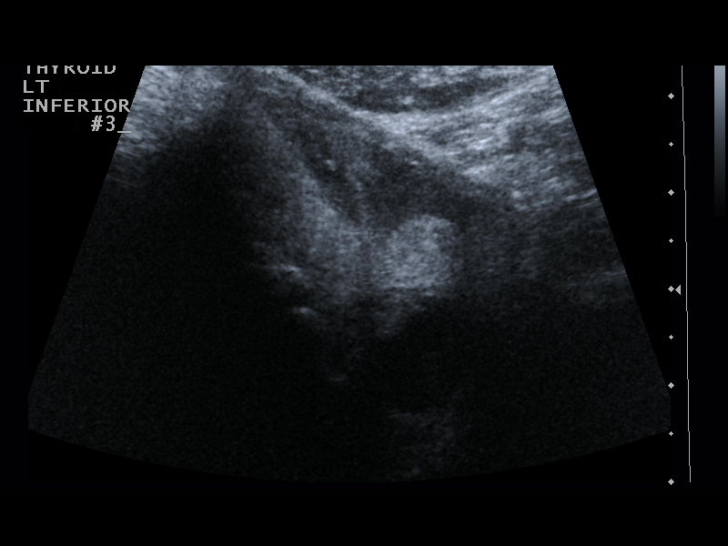
[im 12/14]
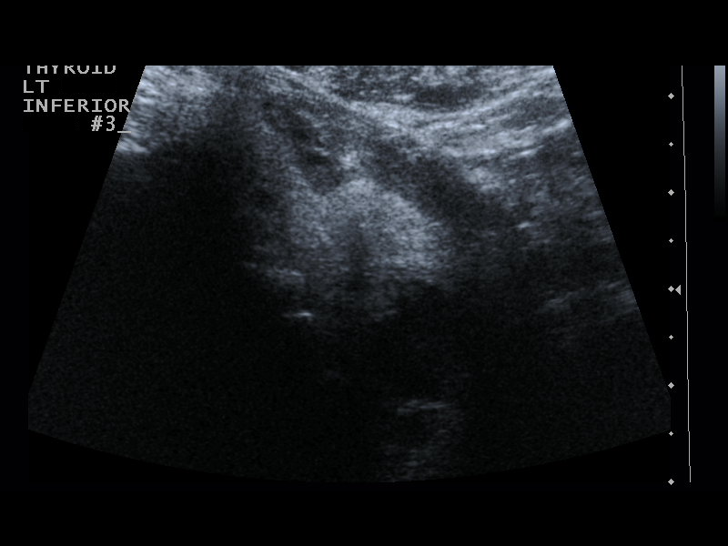
[im 13/14]
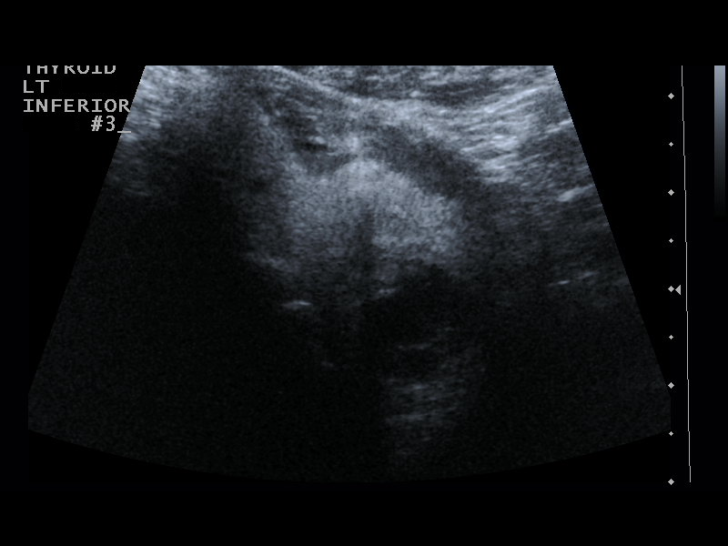
[im 14/14]
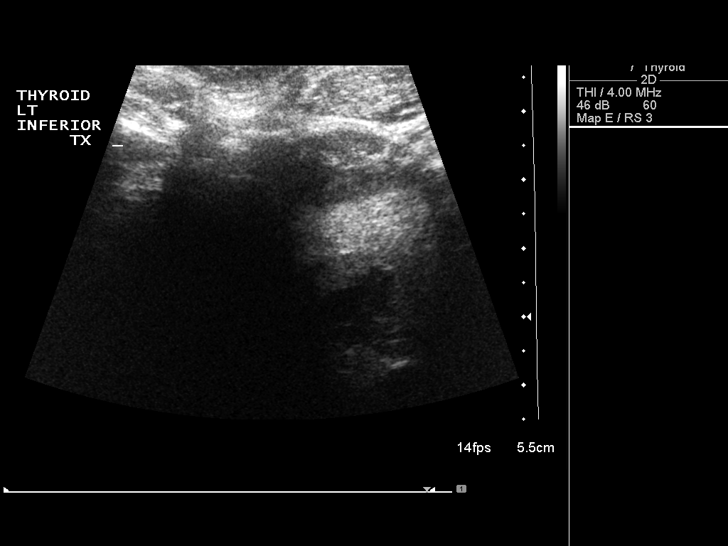

[14 of 14 positions shown; findings below may reference images not displayed]

PROCEDURE:
Thyroid biopsy was thoroughly discussed with the patient and
questions were answered. The benefits, risks, alternatives, and
complications were also discussed. The patient understands and
wishes to proceed with the procedure. Written consent was obtained.

Ultrasound was performed to localize and mark an adequate site for
the biopsy. The patient was then prepped and draped in a normal
sterile fashion. Local anesthesia was provided with 1% lidocaine.
Using direct ultrasound guidance, 3 passes were made using 22 gauge
Nrad needles into the nodule within the left lobe of the thyroid.
Ultrasound was used to confirm needle placements on all occasions.
Specimens were sent to Pathology for analysis.

COMPLICATIONS:
None.
FINDINGS: Images document needle placement in the left lower thyroid nodule.
IMPRESSION: Ultrasound guided needle aspirate biopsy performed of the left
thyroid nodule.

## 2017-12-22 ENCOUNTER — Encounter (HOSPITAL_BASED_OUTPATIENT_CLINIC_OR_DEPARTMENT_OTHER): Payer: Self-pay | Admitting: Emergency Medicine

## 2017-12-22 ENCOUNTER — Emergency Department (HOSPITAL_BASED_OUTPATIENT_CLINIC_OR_DEPARTMENT_OTHER)
Admission: EM | Admit: 2017-12-22 | Discharge: 2017-12-22 | Disposition: A | Discharge status: Home / Self Care | Payer: Medicare Other | Attending: Emergency Medicine | Admitting: Emergency Medicine

## 2017-12-22 ENCOUNTER — Other Ambulatory Visit: Payer: Self-pay

## 2017-12-22 DIAGNOSIS — I132 Hypertensive heart and chronic kidney disease with heart failure and with stage 5 chronic kidney disease, or end stage renal disease: Secondary | ICD-10-CM | POA: Insufficient documentation

## 2017-12-22 DIAGNOSIS — Z79899 Other long term (current) drug therapy: Secondary | ICD-10-CM | POA: Insufficient documentation

## 2017-12-22 DIAGNOSIS — J449 Chronic obstructive pulmonary disease, unspecified: Secondary | ICD-10-CM | POA: Insufficient documentation

## 2017-12-22 DIAGNOSIS — H1013 Acute atopic conjunctivitis, bilateral: Secondary | ICD-10-CM | POA: Diagnosis not present

## 2017-12-22 DIAGNOSIS — I509 Heart failure, unspecified: Secondary | ICD-10-CM | POA: Diagnosis not present

## 2017-12-22 DIAGNOSIS — Z87891 Personal history of nicotine dependence: Secondary | ICD-10-CM | POA: Insufficient documentation

## 2017-12-22 DIAGNOSIS — R42 Dizziness and giddiness: Secondary | ICD-10-CM | POA: Insufficient documentation

## 2017-12-22 DIAGNOSIS — N186 End stage renal disease: Secondary | ICD-10-CM | POA: Insufficient documentation

## 2017-12-22 DIAGNOSIS — J3089 Other allergic rhinitis: Secondary | ICD-10-CM

## 2017-12-22 DIAGNOSIS — Z992 Dependence on renal dialysis: Secondary | ICD-10-CM | POA: Diagnosis not present

## 2017-12-22 DIAGNOSIS — H5789 Other specified disorders of eye and adnexa: Secondary | ICD-10-CM | POA: Diagnosis present

## 2017-12-22 DIAGNOSIS — Z8546 Personal history of malignant neoplasm of prostate: Secondary | ICD-10-CM | POA: Diagnosis not present

## 2017-12-22 MED ORDER — FLUTICASONE PROPIONATE 50 MCG/ACT NA SUSP: 1.0000 | Freq: Every day | NASAL | 0 refills | Status: AC

## 2017-12-22 MED ORDER — LORATADINE 10 MG PO TABS: 10.0000 mg | ORAL_TABLET | Freq: Every day | ORAL | 0 refills | Status: AC

## 2017-12-22 NOTE — ED Triage Notes (Signed)
Runny nose and eyes x 2 weeks. Had HD yesterday and states his BP has been running low

## 2017-12-22 NOTE — Discharge Instructions (Signed)
CALL YOUR PRIMARY CARE DOCTOR ON Monday TO DISCUSS YOUR MEDICINES INCLUDING YOUR NEW MEDICINE, COREG. RETURN TO ER IF YOU HAVE PROBLEMS WITH CHEST PAIN, SHORTNESS OF BREATH, OR FEELING LIKE YOU ARE GOING TO PASS OUT.

## 2017-12-22 NOTE — ED Provider Notes (Signed)
[facility] EMERGENCY DEPARTMENT Provider Note   CSN: [id number] Arrival date & time: 12/22/17  1235     History   Chief Complaint Chief Complaint  Patient presents with  . URI    HPI Michael Graham is a 72 y.o. male.  72yo M w/ PMH including ESRD on HD, COPD, cirrhosis, CHF, hypertension, prostate cancer who presents with runny nose and watery eyes.  For the past 2 weeks, the patient states that he has had persistent watery eyes, runny nose, nasal congestion.  He cannot taste things as well as he usually can.  He denies any associated cough, fevers, sore throat, or recent illness.  He tried an over-the-counter allergy medication one time but had no relief.  He also reports that for the past few weeks he has had low blood pressures after dialysis treatment.  He has told his doctors at dialysis about it.  He recently had Coreg added to his medication regimen and started taking it yesterday.  He sometimes feels lightheaded but no syncope or other related complaints.  The history is provided by the patient.  URI      Past Medical History:  Diagnosis Date  . Arthritis   . Ascites   . Cancer [facility]) 2006   prostate; radiation therapy 2006?  . CHF (congestive heart failure) ([place]) 2005  . Chronic kidney disease    ESRD; on dialysis since 2008 or 2009  . Cirrhosis ([place])   . COPD (chronic obstructive pulmonary disease) ([place])   . Depression   . Diverticulitis   . End stage renal disease on dialysis ([place])   . Erectile dysfunction   . Hypertension   . Renal cysts, acquired, bilateral     Patient Active Problem List   Diagnosis Date Noted  . Arteritis, unspecified ([place]) 05/03/2016  . Left renal mass   . Renal mass, left     Past Surgical History:  Procedure Laterality Date  . APPENDECTOMY    . COLON SURGERY     partial colectomy  . DIALYSIS FISTULA CREATION Left   . IR GENERIC HISTORICAL  10/15/2014   IR RADIOLOGIST EVAL & MGMT 10/15/2014 Sandi Mariscal, MD [facility]  INTERV RAD  . PENILE PROSTHESIS IMPLANT    . RENAL CRYOABLATION Left 09/07/2014        Home Medications    Prior to Admission medications   Medication Sig Start Date End Date Taking? Authorizing Provider  atenolol (TENORMIN) 12.5 mg TABS tablet Take 12.5 mg by mouth daily.    [provider]  benzonatate (TESSALON) 100 MG capsule Take 1 capsule (100 mg total) by mouth 3 (three) times daily as needed for cough. 02/12/16   Sherwood Gambler, MD  cinacalcet (SENSIPAR) 30 MG tablet Take 30 mg by mouth daily.    [provider]  doxycycline (VIBRAMYCIN) 100 MG capsule Take 1 capsule (100 mg total) by mouth 2 (two) times daily. One po bid x 7 days 11/09/15   Comer Locket, PA-C  latanoprost (XALATAN) 0.005 % ophthalmic solution Place 1 drop into both eyes at bedtime.    [provider]  metoprolol (LOPRESSOR) 50 MG tablet Take 50 mg by mouth daily.    [provider]  sevelamer carbonate (RENVELA) 800 MG tablet Take 800 mg by mouth 3 (three) times daily with meals.    [provider]  terazosin (HYTRIN) 5 MG capsule Take 5 mg by mouth at bedtime.    [provider]    Family History No  family history on file.  Social History Social History   Tobacco Use  . Smoking status: Former Smoker    Packs/day: 1.00    Years: 23.00    Pack years: 23.00    Types: Cigarettes    Start date: 08/07/1971    Last attempt to quit: 08/06/1994    Years since quitting: 23.3  . Smokeless tobacco: Never Used  Substance Use Topics  . Alcohol use: No    Alcohol/week: 0.0 oz  . Drug use: Never     Allergies   Lisinopril   Review of Systems Review of Systems All other systems reviewed and are negative except that which was mentioned in HPI   Physical Exam Updated Vital Signs BP 94/62 (BP Location: Right Arm)   Pulse 62   Temp 98 F (36.7 C) (Oral)   Resp 20   Ht 5\' 7"  (1.702 m)   Wt 90.7 kg (200 lb)   SpO2 100%   BMI 31.32 kg/m    Physical Exam  Constitutional: He is oriented to person, place, and time. He appears well-developed and well-nourished. No distress.  HENT:  Head: Normocephalic and atraumatic.  Nose: Nose normal.  Mouth/Throat: No oropharyngeal exudate.  Eyes: Pupils are equal, round, and reactive to light. Conjunctivae are normal.  Mild tearing of R eye  Neck: Neck supple.  Neurological: He is alert and oriented to person, place, and time.  Skin: Skin is warm and dry.  Chronic ulcers on R anterior lower leg, no active drainage or surrounding erythema  Psychiatric: He has a normal mood and affect. Judgment normal.  Nursing note and vitals reviewed.    ED Treatments / Results  Labs (all labs ordered are listed, but only abnormal results are displayed) Labs Reviewed - No data to display  EKG None  Radiology No results found.  Procedures Procedures (including critical care time)  Medications Ordered in ED Medications - No data to display   Initial Impression / Assessment and Plan / ED Course  I have reviewed the triage vital signs and the nursing notes.       PT well appearing and comfortable on exam. He had low-normal BPs with normal MAP.  I suspect his blood pressure may be related to dialysis as he states that often occurs after dialysis.  I have recommended holding his Coreg until he speaks with his outpatient provider given that this medication was recently started.  Regarding his other symptoms, symptoms suggest seasonal allergies and he has no signs or symptoms of acute viral or bacterial process.  Will start on Flonase and Claritin.  Have extensively reviewed return precautions.  Final Clinical Impressions(s) / ED Diagnoses   Final diagnoses:  None    ED Discharge Orders    None       Little, Wenda Overland, MD 12/22/17 1429

## 2018-04-04 IMAGING — CT CT ABD-PELV W/ CM
2 of 5 series · 15 of 46 positions shown, 17 images · IV contrast (APPLIED)
Comparison: Most recent CT 09/06/2015.  CT 08/05/2015 also reviewed

CLINICAL DATA: Diffuse abdominal pain for 2 days.

EXAM:
CT ABDOMEN AND PELVIS WITH CONTRAST
TECHNIQUE: Multidetector CT imaging of the abdomen and pelvis was performed
using the standard protocol following bolus administration of
intravenous contrast.
CONTRAST:  100 cc Xsovue-GBB IV

[Series 2: axial st · axial · 0.95mm/px · z∈[-420,+5]mm · 12 of 95 slices shown, 14 images]
[im 5/95  soft-tissue]
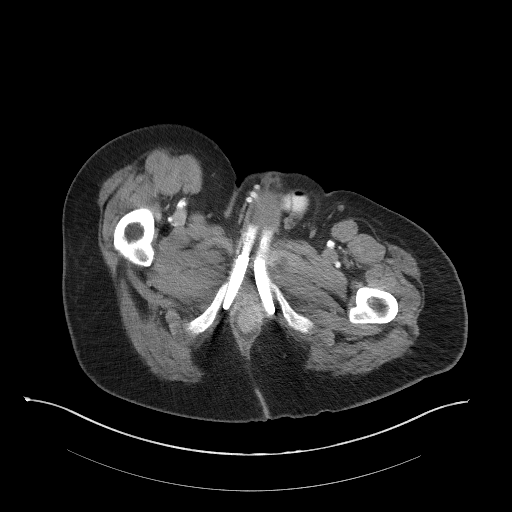
[im 5/95  bone]
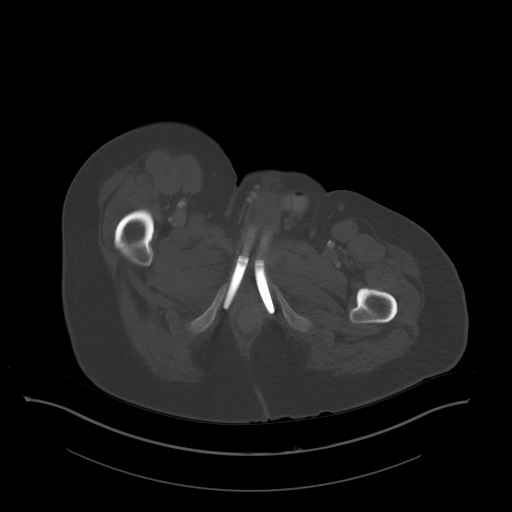
[im 15/95  soft-tissue]
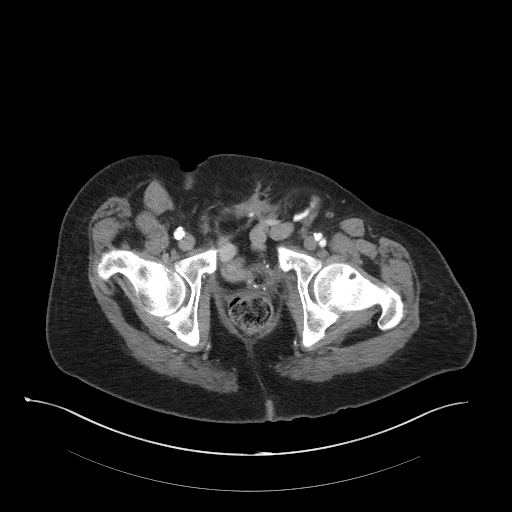
[im 20/95  soft-tissue]
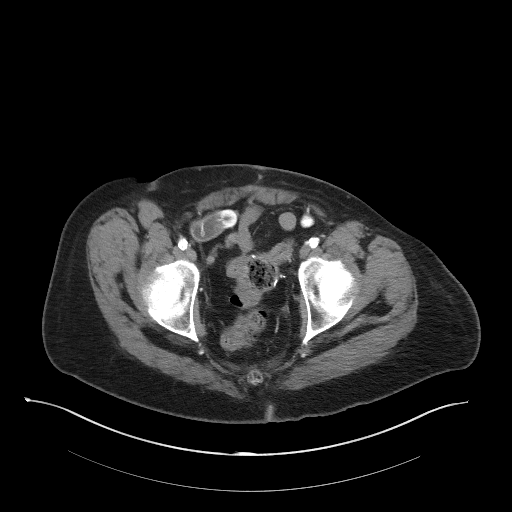
[im 30/95  soft-tissue]
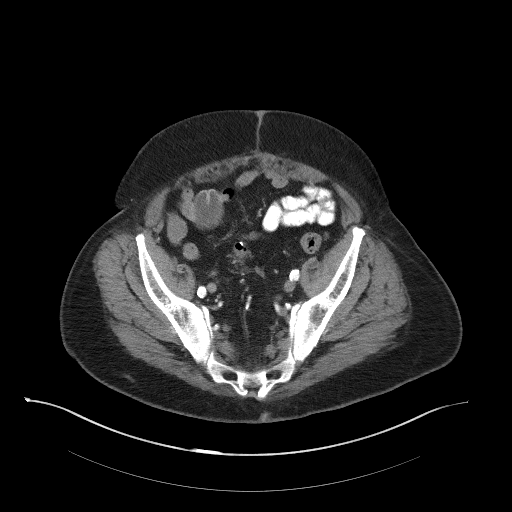
[im 35/95  soft-tissue]
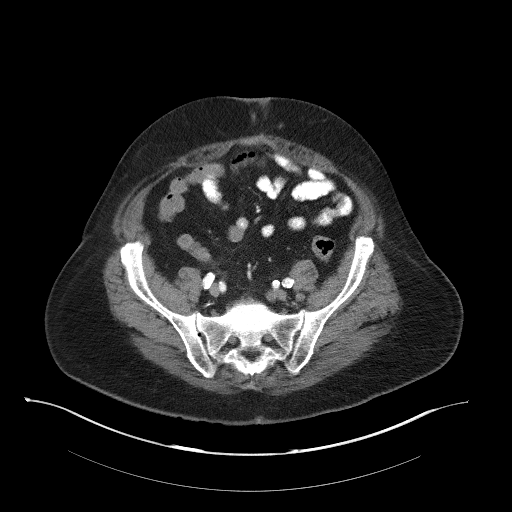
[im 45/95  soft-tissue]
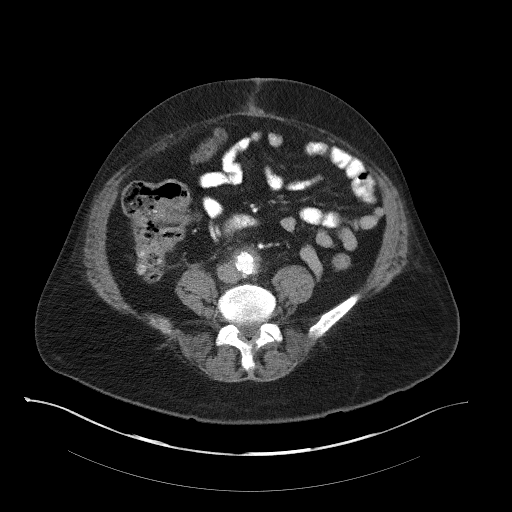
[im 50/95  soft-tissue]
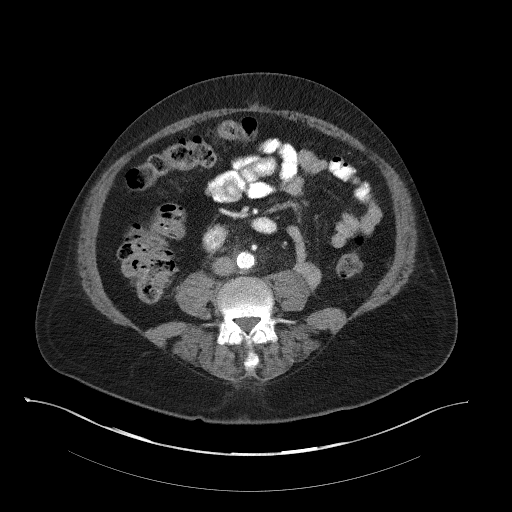
[im 60/95  soft-tissue]
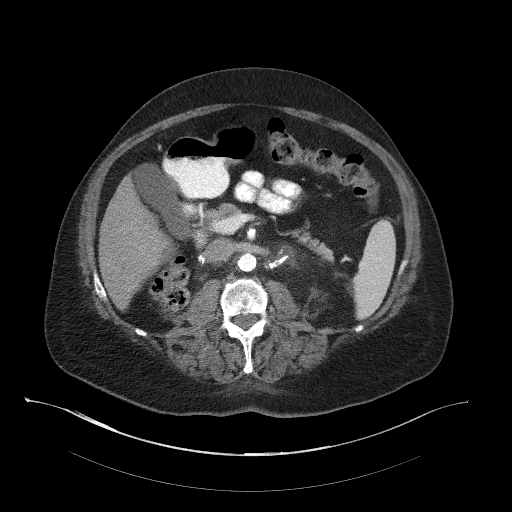
[im 65/95  soft-tissue]
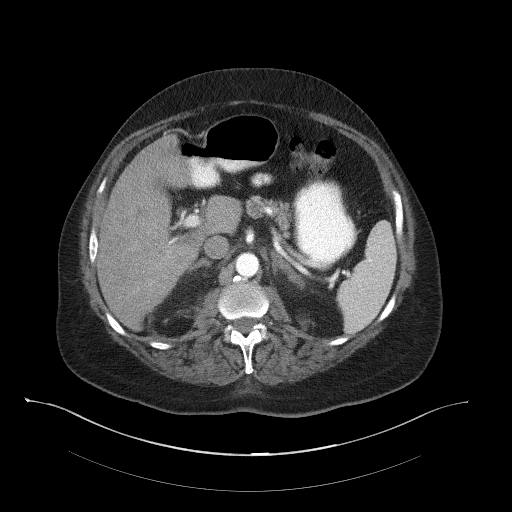
[im 65/95  bone]
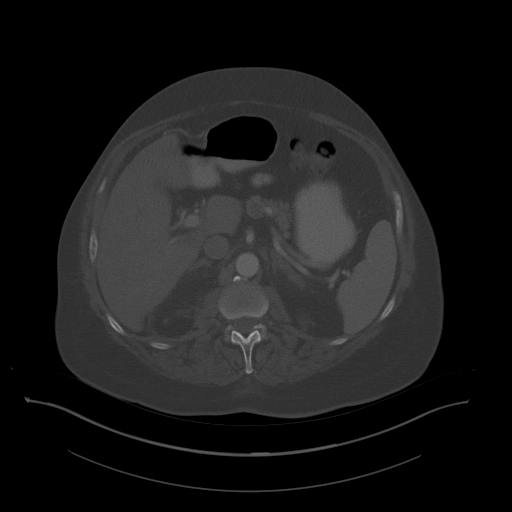
[im 75/95  soft-tissue]
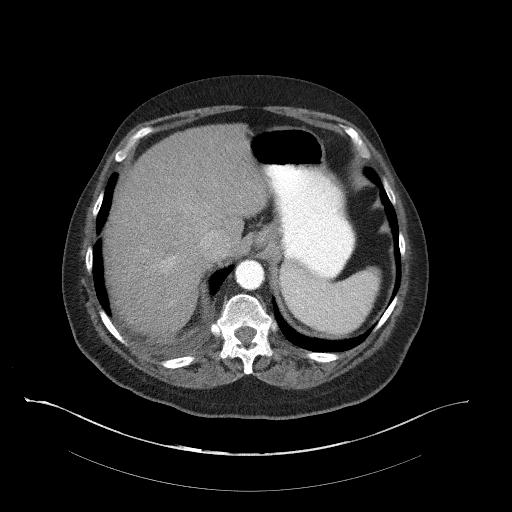
[im 80/95  soft-tissue]
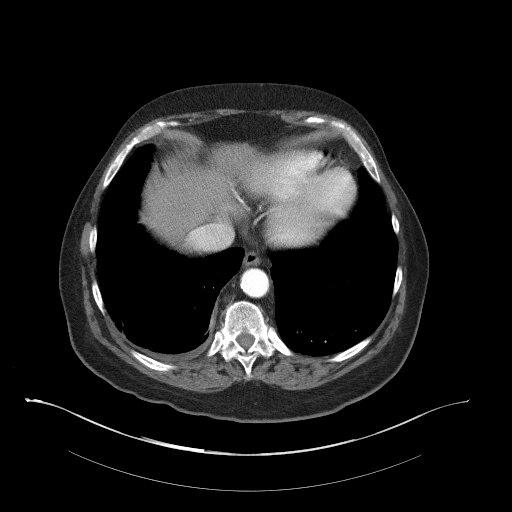
[im 90/95  soft-tissue]
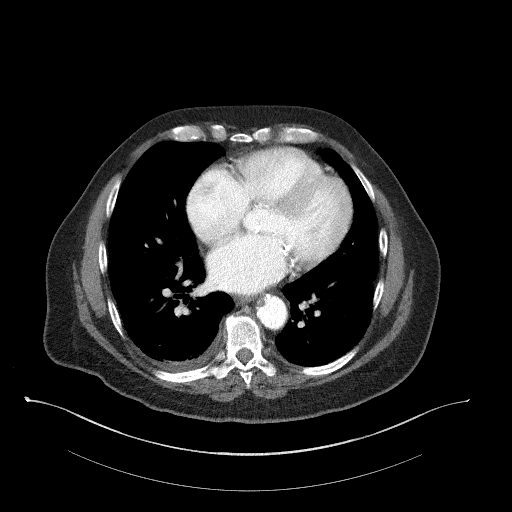

[Series 5: coronal st · coronal · 0.73mm/px · 3 of 90 slices shown]
[im 30/90  soft-tissue]
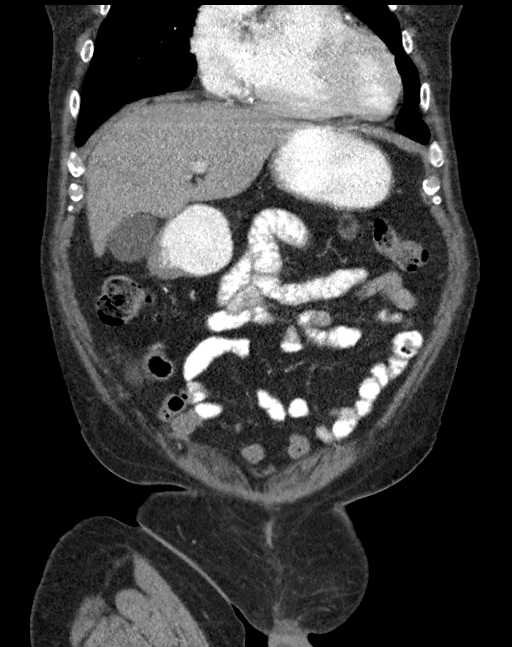
[im 40/90  soft-tissue]
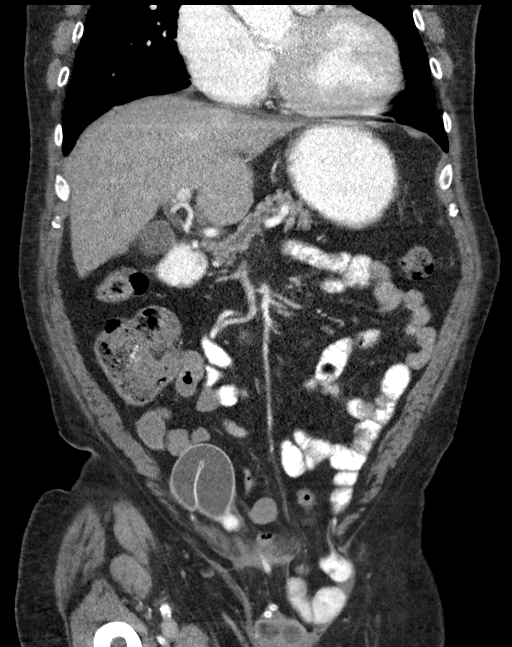
[im 50/90  soft-tissue]
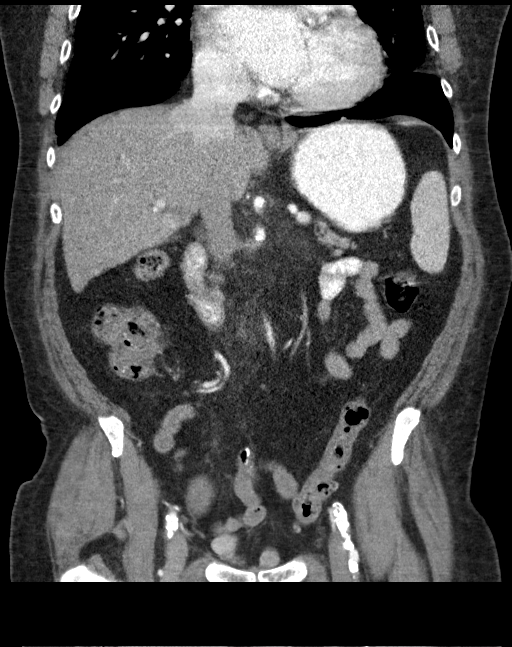

[15 of 46 positions shown; findings below may reference images not displayed]

FINDINGS: Lung bases: Small right pleural effusion, appears chronic. Adjacent
atelectasis. There are coronary artery calcifications.

Liver: Enhancing focus in the periphery of segment 7 of the liver,
not confidently seen previously.

Hepatobiliary: Gallbladder physiologically distended, no calcified
stone. No biliary dilatation.

Pancreas: Pancreatic atrophy.  No ductal dilatation or inflammation.

Spleen:  Normal.

Adrenals:  Stable adrenal thickening.

Kidneys: Post bilateral nephrectomies. Mild stranding in the
nephrectomy bed to without focal soft tissue mass.

Stomach/bowel: Stomach physiologically distended. No dilated small
or large bowel loops. There is multifocal colonic diverticulosis
without diverticulitis. Enteric sutures in the sigmoid colon.
Appendix is not confidently identified, no pericecal inflammation.
Left inguinal hernia contains nonobstructed small bowel.

Lymphatic/vascular: Atherosclerosis of the abdominal aorta. There is
periaortic stranding and thickening distally in at the iliac
bifurcation. No frank retroperitoneal adenopathy. No pelvic
adenopathy.

Reproductive: Penile prosthesis in place.  Post prostatectomy.

Bladder:  Post cystectomy.

Other:  No free air free fluid.  No intra-abdominal abscess.

Musculoskeletal:  No acute or suspicious abnormality.
IMPRESSION: 1. Soft tissue stranding and thickening about the distal abdominal
aorta and iliac bifurcation consistent with vasculitis or acute
retroperitoneal fibrosis. There is dense atherosclerosis of the
aorta and its branches which is unchanged.
2. New enhancing focus in the subcapsular right lobe of the liver,
nonspecific. Hypervascular metastasis is not excluded. Recommend
hepatic MRI on a nonemergent basis.
3. Left inguinal hernia containing nonobstructing small bowel.
Colonic diverticulosis without diverticulitis.
4. Post bilateral nephrectomies, cystectomy, and prostatectomy.

## 2018-08-12 DIAGNOSIS — J441 Chronic obstructive pulmonary disease with (acute) exacerbation: Secondary | ICD-10-CM

## 2018-08-12 DIAGNOSIS — J181 Lobar pneumonia, unspecified organism: Secondary | ICD-10-CM

## 2018-08-12 DIAGNOSIS — J9621 Acute and chronic respiratory failure with hypoxia: Secondary | ICD-10-CM | POA: Diagnosis not present

## 2018-08-12 DIAGNOSIS — N186 End stage renal disease: Secondary | ICD-10-CM

## 2018-08-13 DIAGNOSIS — J181 Lobar pneumonia, unspecified organism: Secondary | ICD-10-CM

## 2018-08-13 DIAGNOSIS — J9621 Acute and chronic respiratory failure with hypoxia: Secondary | ICD-10-CM | POA: Diagnosis not present

## 2018-08-13 DIAGNOSIS — J441 Chronic obstructive pulmonary disease with (acute) exacerbation: Secondary | ICD-10-CM

## 2018-08-13 DIAGNOSIS — N186 End stage renal disease: Secondary | ICD-10-CM

## 2018-08-14 DIAGNOSIS — J9621 Acute and chronic respiratory failure with hypoxia: Secondary | ICD-10-CM | POA: Diagnosis not present

## 2018-08-14 DIAGNOSIS — J441 Chronic obstructive pulmonary disease with (acute) exacerbation: Secondary | ICD-10-CM

## 2018-08-14 DIAGNOSIS — J181 Lobar pneumonia, unspecified organism: Secondary | ICD-10-CM

## 2018-08-14 DIAGNOSIS — N186 End stage renal disease: Secondary | ICD-10-CM

## 2018-08-15 DIAGNOSIS — J181 Lobar pneumonia, unspecified organism: Secondary | ICD-10-CM

## 2018-08-15 DIAGNOSIS — J441 Chronic obstructive pulmonary disease with (acute) exacerbation: Secondary | ICD-10-CM

## 2018-08-15 DIAGNOSIS — N186 End stage renal disease: Secondary | ICD-10-CM | POA: Diagnosis not present

## 2018-08-15 DIAGNOSIS — J9621 Acute and chronic respiratory failure with hypoxia: Secondary | ICD-10-CM | POA: Diagnosis not present

## 2018-08-16 DIAGNOSIS — J9621 Acute and chronic respiratory failure with hypoxia: Secondary | ICD-10-CM | POA: Diagnosis not present

## 2018-08-16 DIAGNOSIS — J181 Lobar pneumonia, unspecified organism: Secondary | ICD-10-CM

## 2018-08-16 DIAGNOSIS — J441 Chronic obstructive pulmonary disease with (acute) exacerbation: Secondary | ICD-10-CM | POA: Diagnosis not present

## 2018-08-16 DIAGNOSIS — N186 End stage renal disease: Secondary | ICD-10-CM

## 2018-08-17 DIAGNOSIS — N186 End stage renal disease: Secondary | ICD-10-CM

## 2018-08-17 DIAGNOSIS — J181 Lobar pneumonia, unspecified organism: Secondary | ICD-10-CM | POA: Diagnosis not present

## 2018-08-17 DIAGNOSIS — J441 Chronic obstructive pulmonary disease with (acute) exacerbation: Secondary | ICD-10-CM | POA: Diagnosis not present

## 2018-08-17 DIAGNOSIS — J9621 Acute and chronic respiratory failure with hypoxia: Secondary | ICD-10-CM | POA: Diagnosis not present

## 2018-08-18 DIAGNOSIS — J9621 Acute and chronic respiratory failure with hypoxia: Secondary | ICD-10-CM | POA: Diagnosis not present

## 2018-08-18 DIAGNOSIS — J441 Chronic obstructive pulmonary disease with (acute) exacerbation: Secondary | ICD-10-CM | POA: Diagnosis not present

## 2018-08-18 DIAGNOSIS — J181 Lobar pneumonia, unspecified organism: Secondary | ICD-10-CM | POA: Diagnosis not present

## 2018-08-18 DIAGNOSIS — N186 End stage renal disease: Secondary | ICD-10-CM | POA: Diagnosis not present

## 2018-11-24 DEATH — deceased
